# Patient Record
Sex: Male | Born: 1960 | Race: Black or African American | Hispanic: No | Smoking: Never smoker
Health system: Southern US, Community
[De-identification: ages and names within clinical notes are randomized; demographics above are authoritative.]

## PROBLEM LIST (undated history)

## (undated) DIAGNOSIS — B182 Chronic viral hepatitis C: Secondary | ICD-10-CM

## (undated) DIAGNOSIS — C801 Malignant (primary) neoplasm, unspecified: Secondary | ICD-10-CM

## (undated) DIAGNOSIS — F1020 Alcohol dependence, uncomplicated: Secondary | ICD-10-CM

## (undated) DIAGNOSIS — R188 Other ascites: Secondary | ICD-10-CM

## (undated) HISTORY — PX: PARACENTESIS: SHX844

---

## 2007-07-29 ENCOUNTER — Ambulatory Visit: Payer: Self-pay | Admitting: Gastroenterology

## 2013-06-23 ENCOUNTER — Ambulatory Visit: Payer: Self-pay | Admitting: Radiation Oncology

## 2016-02-29 ENCOUNTER — Emergency Department
Admission: EM | Admit: 2016-02-29 | Discharge: 2016-02-29 | Disposition: A | Discharge status: Home / Self Care | Payer: Self-pay | Attending: Emergency Medicine | Admitting: Emergency Medicine

## 2016-02-29 ENCOUNTER — Encounter: Payer: Self-pay | Admitting: Emergency Medicine

## 2016-02-29 DIAGNOSIS — R188 Other ascites: Secondary | ICD-10-CM | POA: Insufficient documentation

## 2016-02-29 DIAGNOSIS — R1084 Generalized abdominal pain: Secondary | ICD-10-CM

## 2016-02-29 HISTORY — DX: Chronic viral hepatitis C: B18.2

## 2016-02-29 LAB — URINALYSIS COMPLETE WITH MICROSCOPIC (ARMC ONLY)
BACTERIA UA: NONE SEEN
BILIRUBIN URINE: NEGATIVE
GLUCOSE, UA: NEGATIVE mg/dL
HGB URINE DIPSTICK: NEGATIVE
Ketones, ur: NEGATIVE mg/dL
Leukocytes, UA: NEGATIVE
Nitrite: NEGATIVE
Protein, ur: NEGATIVE mg/dL
RBC / HPF: NONE SEEN RBC/hpf (ref 0–5)
SQUAMOUS EPITHELIAL / LPF: NONE SEEN
Specific Gravity, Urine: 1.006 (ref 1.005–1.030)
pH: 5 (ref 5.0–8.0)

## 2016-02-29 LAB — COMPREHENSIVE METABOLIC PANEL
ALK PHOS: 267 U/L — AB (ref 38–126)
ALT: 76 U/L — ABNORMAL HIGH (ref 17–63)
AST: 214 U/L — AB (ref 15–41)
Albumin: 3 g/dL — ABNORMAL LOW (ref 3.5–5.0)
Anion gap: 8 (ref 5–15)
BILIRUBIN TOTAL: 2.4 mg/dL — AB (ref 0.3–1.2)
BUN: 8 mg/dL (ref 6–20)
CO2: 25 mmol/L (ref 22–32)
Calcium: 8.8 mg/dL — ABNORMAL LOW (ref 8.9–10.3)
Chloride: 101 mmol/L (ref 101–111)
Creatinine, Ser: 0.88 mg/dL (ref 0.61–1.24)
GFR calc Af Amer: >60 mL/min (ref >60)
GLUCOSE: 91 mg/dL (ref 65–99)
POTASSIUM: 4.4 mmol/L (ref 3.5–5.1)
Sodium: 134 mmol/L — ABNORMAL LOW (ref 135–145)
Total Protein: 8 g/dL (ref 6.5–8.1)

## 2016-02-29 LAB — CBC
HEMATOCRIT: 35.5 % — AB (ref 40.0–52.0)
HEMOGLOBIN: 12.4 g/dL — AB (ref 13.0–18.0)
MCH: 33.4 pg (ref 26.0–34.0)
MCHC: 34.9 g/dL (ref 32.0–36.0)
MCV: 95.9 fL (ref 80.0–100.0)
Platelets: 96 10*3/uL — ABNORMAL LOW (ref 150–440)
RBC: 3.7 MIL/uL — ABNORMAL LOW (ref 4.40–5.90)
RDW: 14.2 % (ref 11.5–14.5)
WBC: 3.9 10*3/uL (ref 3.8–10.6)

## 2016-02-29 LAB — LIPASE, BLOOD: Lipase: 47 U/L (ref 11–51)

## 2016-02-29 MED ORDER — OXYCODONE HCL 5 MG PO TABS: 5.0000 mg | ORAL_TABLET | Freq: Three times a day (TID) | ORAL | Status: AC | PRN

## 2016-02-29 MED ORDER — OXYCODONE-ACETAMINOPHEN 5-325 MG PO TABS: 1.0000 | ORAL_TABLET | Freq: Four times a day (QID) | ORAL | Status: DC | PRN

## 2016-02-29 NOTE — Discharge Instructions (Signed)
Please call the number provided for GI medicine to arrange a follow-up appointment as soon as possible. Take pain medication as needed, as written. As we discussed do not take pain medication while under the influence of alcohol. Return to the emergency department for any worsening abdominal pain, fever, or any other symptom personally concerning to yourself.   Abdominal Pain, Adult Many things can cause abdominal pain. Usually, abdominal pain is not caused by a disease and will improve without treatment. It can often be observed and treated at home. Your health care provider will do a physical exam and possibly order blood tests and X-rays to help determine the seriousness of your pain. However, in many cases, more time must pass before a clear cause of the pain can be found. Before that point, your health care provider may not know if you need more testing or further treatment. HOME CARE INSTRUCTIONS Monitor your abdominal pain for any changes. The following actions may help to alleviate any discomfort you are experiencing:  Only take over-the-counter or prescription medicines as directed by your health care provider.  Do not take laxatives unless directed to do so by your health care provider.  Try a clear liquid diet (broth, tea, or water) as directed by your health care provider. Slowly move to a bland diet as tolerated. SEEK MEDICAL CARE IF:  You have unexplained abdominal pain.  You have abdominal pain associated with nausea or diarrhea.  You have pain when you urinate or have a bowel movement.  You experience abdominal pain that wakes you in the night.  You have abdominal pain that is worsened or improved by eating food.  You have abdominal pain that is worsened with eating fatty foods.  You have a fever. SEEK IMMEDIATE MEDICAL CARE IF:  Your pain does not go away within 2 hours.  You keep throwing up (vomiting).  Your pain is felt only in portions of the abdomen, such as  the right side or the left lower portion of the abdomen.  You pass bloody or black tarry stools. MAKE SURE YOU:  Understand these instructions.  Will watch your condition.  Will get help right away if you are not doing well or get worse.   This information is not intended to replace advice given to you by your health care provider. Make sure you discuss any questions you have with your health care provider.   Document Released: 05/17/2005 Document Revised: 04/28/2015 Document Reviewed: 04/16/2013 Elsevier Interactive Patient Education [employer].

## 2016-02-29 NOTE — ED Provider Notes (Signed)
[facility] Emergency Department Provider Note  Time seen: 11:07 PM  I have reviewed the triage vital signs and the nursing notes.   HISTORY  Chief Complaint Abdominal Pain and Bloated    HPI Johnny [name] is a 55 y.o. male with a past medical history of hepatitis C, alcohol abuse who presents to the emergency department with a distended stomach. According to the patient he was diagnosed with hepatitis C proximal neck 7 years ago, states he continues to drink alcohol however he has cut back to 2-4 beers per day. He states for the last 2 months his abdomen has continued to swell. States it is now very swollen and he gets full after 1 beer or when he eats small amounts of food. Denies any acute worsening today. States he is taking his wife to the emergency department for migraine headaches and decided to get his abdomen evaluated. Denies any fever. States diffuse abdominal tightness but denies any abdominal "pain." Denies a nausea or vomiting. Denies diarrhea, black or bloody stool.     Past Medical History  Diagnosis Date  . Hep C w/ coma, chronic (HCC)     There are no active problems to display for this patient.   History reviewed. No pertinent past surgical history.  No current outpatient prescriptions on file.  Allergies Review of patient's allergies indicates no known allergies.  History reviewed. No pertinent family history.  Social History Social History  Substance Use Topics  . Smoking status: Never Smoker   . Smokeless tobacco: None  . Alcohol Use: Yes    Review of Systems Constitutional: Negative for fever Cardiovascular: Negative for chest pain. Respiratory: Negative for shortness of breath. Gastrointestinal: Positive for abdominal tightness, discomfort, nd distention. Genitourinary: Negative for dysuria. Musculoskeletal: Negative for back pain. Neurological: Negative for headache 10-point ROS otherwise  negative.  ____________________________________________   PHYSICAL EXAM:  VITAL SIGNS: ED Triage Vitals  Enc Vitals Group     BP 02/29/16 1955 143/111 mmHg     Pulse Rate 02/29/16 1955 75     Resp 02/29/16 1955 20     Temp 02/29/16 1955 98.1 F (36.7 C)     Temp Source 02/29/16 1955 Oral     SpO2 02/29/16 1955 98 %     Weight 02/29/16 1955 139 lb (63.05 kg)     Height 02/29/16 1955 5\' 7"  (1.702 m)     Head Cir --      Peak Flow --      Pain Score 02/29/16 1956 6     Pain Loc --      Pain Edu? --      Excl. in [place]? --     Constitutional: Alert and oriented. Well appearing and in no distress. Eyes: Mild scleral icterus.ENT   Head: Normocephalic and atraumatic   Mouth/Throat: Mucous membranes are moist. Cardiovascular: Normal rate, regular rhythm. No murmur Respiratory: Normal respiratory effort without tachypnea nor retractions. Breath sounds are clear  Gastrointestinal:  Mild to moderate abdominal distention, no focal tenderness to palpation. No rebound or guarding. Musculoskeletal: Nontender with normal range of motion in all extremities.  Neurologic:  Normal speech and language. No gross focal neurologic deficits  Skin:  Skin is warm, dry and intact.  Psychiatric: Mood and affect are normal.  ____________________________________________    INITIAL IMPRESSION / ASSESSMENT AND PLAN / ED COURSE  Pertinent labs & imaging results that were available during my care of the patient were reviewed by me and considered  in my medical decision making (see chart for details).  The patient presents emergency department with abdominal distention which she states is been ongoing for the past several months. Patient's labs show elevated LFTs likely do to his chronic alcohol use and hepatitis C. Patient has no right upper quadrant tenderness to palpation. Patient states occasional abdominal pain when he feels very "bloated." I had a long conversation with the patient that he needs  to stop drinking alcohol as this will only progressed until his liver completely fails. Patient understands and states he will try his best to reduce or stop drinking alcohol. I will refer the patient to GI medicine for further workup and possible arrangement of outpatient paracentesis. Patient agreeable to plan. We will discharge with a short course of pain medication. No clinical concern for SBP.  ____________________________________________   FINAL CLINICAL IMPRESSION(S) / ED DIAGNOSES  abdominal ascites   Harvest Dark, MD 02/29/16 2312

## 2016-02-29 NOTE — ED Notes (Signed)
Pt reports being diagnosed with Hepatitis C 5-7 years ago.  Pt has distended firm stomach and slight jaundice to the sclera of the eyes.  Pt reports that he also drinks 2-4 12 oz beers a day but cannot eat much due to swelling in abdomen.  Pt reports diarrhea after meals as well that happens off an on.  Pt came into room with a honey bun.  Instructed pt to stay NPO until Doctor saw pt and confirmed if it is okay to eat at this time.  Pt hooked up to monitors and given remote.  Pt denied need for blanket at this time.

## 2016-02-29 NOTE — ED Notes (Signed)
Pt arrived to the ED for complaints of abdominal pain, bloated abdomen and SOB. Pt states that he was recently diagnosed with Hep C and he does not know if it has anything to do with it. Pt is AOx4 in no apparent distress.

## 2016-03-01 NOTE — ED Notes (Signed)
Pt discharged to home.  Family member driving.  Discharge instructions reviewed.  Verbalized understanding.  No questions or concerns at this time.  Teach back verified.  Pt in NAD.  No items left in ED.   

## 2016-03-03 ENCOUNTER — Emergency Department: Payer: Self-pay

## 2016-03-03 ENCOUNTER — Encounter: Payer: Self-pay | Admitting: Emergency Medicine

## 2016-03-03 ENCOUNTER — Emergency Department
Admission: EM | Admit: 2016-03-03 | Discharge: 2016-03-03 | Disposition: A | Discharge status: Home / Self Care | Payer: Self-pay | Attending: Emergency Medicine | Admitting: Emergency Medicine

## 2016-03-03 DIAGNOSIS — R109 Unspecified abdominal pain: Secondary | ICD-10-CM | POA: Insufficient documentation

## 2016-03-03 DIAGNOSIS — Z79899 Other long term (current) drug therapy: Secondary | ICD-10-CM | POA: Insufficient documentation

## 2016-03-03 LAB — BODY FLUID CELL COUNT WITH DIFFERENTIAL
Eos, Fluid: 0 %
Lymphs, Fluid: 100 %
Monocyte-Macrophage-Serous Fluid: 0 %
Neutrophil Count, Fluid: 0 %
Other Cells, Fluid: 0 %
WBC FLUID: 352 uL

## 2016-03-03 LAB — CBC
HCT: 34.1 % — ABNORMAL LOW (ref 40.0–52.0)
Hemoglobin: 11.9 g/dL — ABNORMAL LOW (ref 13.0–18.0)
MCH: 33.9 pg (ref 26.0–34.0)
MCHC: 34.9 g/dL (ref 32.0–36.0)
MCV: 97.3 fL (ref 80.0–100.0)
PLATELETS: 88 10*3/uL — AB (ref 150–440)
RBC: 3.51 MIL/uL — AB (ref 4.40–5.90)
RDW: 13.7 % (ref 11.5–14.5)
WBC: 4 10*3/uL (ref 3.8–10.6)

## 2016-03-03 LAB — COMPREHENSIVE METABOLIC PANEL
ALT: 70 U/L — AB (ref 17–63)
ANION GAP: 8 (ref 5–15)
AST: 168 U/L — ABNORMAL HIGH (ref 15–41)
Albumin: 2.7 g/dL — ABNORMAL LOW (ref 3.5–5.0)
Alkaline Phosphatase: 224 U/L — ABNORMAL HIGH (ref 38–126)
BUN: 10 mg/dL (ref 6–20)
CHLORIDE: 101 mmol/L (ref 101–111)
CO2: 24 mmol/L (ref 22–32)
Calcium: 8.7 mg/dL — ABNORMAL LOW (ref 8.9–10.3)
Creatinine, Ser: 0.8 mg/dL (ref 0.61–1.24)
GFR calc non Af Amer: >60 mL/min (ref >60)
Glucose, Bld: 93 mg/dL (ref 65–99)
POTASSIUM: 4.1 mmol/L (ref 3.5–5.1)
SODIUM: 133 mmol/L — AB (ref 135–145)
Total Bilirubin: 3.5 mg/dL — ABNORMAL HIGH (ref 0.3–1.2)
Total Protein: 7.3 g/dL (ref 6.5–8.1)

## 2016-03-03 LAB — PROTIME-INR
INR: 1.14
Prothrombin Time: 14.8 seconds (ref 11.4–15.0)

## 2016-03-03 NOTE — ED Provider Notes (Signed)
[facility] Emergency Department Provider Note  Time seen: 8:25 AM  I have reviewed the triage vital signs and the nursing notes.   HISTORY  Chief Complaint Abdominal Pain    HPI Johnny [name] is a 55 y.o. male with a past medical history of hepatitis C, alcohol abuse who presents the emergency department with continued abdominal distention and discomfort. According to the patient he was seen here 2 days ago for the same by myself. He states he is not drinking any alcohol since he was last seen in the emergency department. States his abdomen continues to be very swollen and equal in cannot eat or drink anything due to the abdominal swelling and discomfort/fullness that it causes. Denies any "pain." States his abdomen feels extremely tight. Denies fever. Denies nausea, vomiting, diarrhea.     Past Medical History  Diagnosis Date  . Hep C w/ coma, chronic (HCC)     There are no active problems to display for this patient.   History reviewed. No pertinent past surgical history.  Current Outpatient Rx  Name  Route  Sig  Dispense  Refill  . oxyCODONE (ROXICODONE) 5 MG immediate release tablet   Oral   Take 1 tablet (5 mg total) by mouth every 8 (eight) hours as needed.   10 tablet   0     Allergies Review of patient's allergies indicates no known allergies.  No family history on file.  Social History Social History  Substance Use Topics  . Smoking status: Never Smoker   . Smokeless tobacco: None  . Alcohol Use: Yes    Review of Systems Constitutional: Negative for fever. Cardiovascular: Negative for chest pain. Respiratory: Negative for shortness of breath. Gastrointestinal: Abdominal tightness and distention Musculoskeletal: Negative for back pain. Neurological: Negative for headache 10-point ROS otherwise negative.  ____________________________________________   PHYSICAL EXAM:  VITAL SIGNS: ED Triage Vitals  Enc Vitals  Group     BP 03/03/16 0817 139/97 mmHg     Pulse Rate 03/03/16 0817 96     Resp 03/03/16 0817 18     Temp 03/03/16 0817 98.1 F (36.7 C)     Temp Source 03/03/16 0817 Oral     SpO2 03/03/16 0817 100 %     Weight 03/03/16 0818 135 lb (61.236 kg)     Height 03/03/16 0818 5\' 7"  (1.702 m)     Head Cir --      Peak Flow --      Pain Score --      Pain Loc --      Pain Edu? --      Excl. in [place]? --     Constitutional: Alert and oriented. Well appearing and in no distress. Eyes: Normal exam ENT   Head: Normocephalic and atraumatic.   Mouth/Throat: Mucous membranes are moist. Cardiovascular: Normal rate, regular rhythm. No murmur Respiratory: Normal respiratory effort without tachypnea nor retractions. Breath sounds are clear  Gastrointestinal: Moderately distended, dull percussion consistent with ascitic fluid. Tight but no tenderness to palpation. Musculoskeletal: Nontender with normal range of motion in all extremities.  Neurologic:  Normal speech and language. No gross focal neurologic deficits Skin:  Skin is warm, dry and intact.  Psychiatric: Mood and affect are normal. Speech and behavior are normal  ____________________________________________   INITIAL IMPRESSION / ASSESSMENT AND PLAN / ED COURSE  Pertinent labs & imaging results that were available during my care of the patient were reviewed by me and considered in my  medical decision making (see chart for details).  The patient presents the emergency department with continued abdominal distention and tightness. Patient admits alcohol use besides last use was 3 days ago. Patient's abdomen is nontender, but it is distended, taut, with dull percussion consistent with significant ascitic fluid. Patient has never had a paracentesis performed. Patient was seen in the emergency department 2 days ago for the same told to follow-up with GI medicine, the patient has not done so yet. I once again discussed with the patient the  need to discontinue alcohol use and follow-up with GI medicine for ongoing treatment. Patient is agreeable to this plan. I will discuss with interventional radiology to see if we can get an ultrasound-guided paracentesis performed today.  Ultrasound-guided paracentesis was per patient to follow-up with GI medicine as scheduled. formed today removing 3.8 L. Labs are largely at the patient's baseline, ascitic fluid appears normal. ____________________________________________   FINAL CLINICAL IMPRESSION(S) / ED DIAGNOSES  Abdominal distention   Harvest Dark, MD 03/03/16 1157

## 2016-03-03 NOTE — Procedures (Signed)
Successful US guided paracentesis yielding 3.8 L of serous ascitic fluid. Sample sent to laboratory as requested. EBL: None No immediate post procedural complications.   Ronny Bacon, MD Pager #: [phone]

## 2016-03-03 NOTE — ED Notes (Signed)
Pt here today with continual abd swelling, pt states that he was seen a week ago and told that if he couldn't get into see his primary for re-eval to return to er, pt returning due to the continual abd swelling. No distress noted, cont to monitor

## 2016-03-03 NOTE — ED Notes (Signed)
Pt. Verbalizes understanding of d/c instructions, and follow-up. VS stable.  Pt. In NAD at time of d/c and denies concerns. Pt. ambulatory Out of the unit wit steady gait and in good spirits.

## 2016-03-03 NOTE — Discharge Instructions (Signed)

## 2016-03-03 NOTE — ED Notes (Signed)
Pt taken to us.

## 2016-03-03 NOTE — ED Notes (Signed)
States he was seen last week and was told if he could not f/u within a week to "return to the ER to get his abd drained".  NAD

## 2016-03-06 LAB — CYTOLOGY - NON PAP

## 2016-03-07 LAB — BODY FLUID CULTURE: Culture: NO GROWTH

## 2016-03-23 ENCOUNTER — Other Ambulatory Visit: Payer: Self-pay | Admitting: Nurse Practitioner

## 2016-03-23 ENCOUNTER — Ambulatory Visit
Admission: RE | Admit: 2016-03-23 | Discharge: 2016-03-23 | Disposition: A | Discharge status: Home / Self Care | Payer: Self-pay | Source: Ambulatory Visit | Attending: Nurse Practitioner | Admitting: Nurse Practitioner

## 2016-03-23 DIAGNOSIS — B1920 Unspecified viral hepatitis C without hepatic coma: Secondary | ICD-10-CM

## 2016-03-23 DIAGNOSIS — R1084 Generalized abdominal pain: Secondary | ICD-10-CM

## 2016-03-23 DIAGNOSIS — R188 Other ascites: Secondary | ICD-10-CM

## 2016-03-23 DIAGNOSIS — B182 Chronic viral hepatitis C: Secondary | ICD-10-CM

## 2016-03-23 DIAGNOSIS — R140 Abdominal distension (gaseous): Secondary | ICD-10-CM

## 2016-03-23 DIAGNOSIS — K7460 Unspecified cirrhosis of liver: Secondary | ICD-10-CM | POA: Insufficient documentation

## 2016-03-23 DIAGNOSIS — K8020 Calculus of gallbladder without cholecystitis without obstruction: Secondary | ICD-10-CM | POA: Insufficient documentation

## 2016-03-23 NOTE — Discharge Instructions (Signed)
Paracentesis, Care After °Refer to this sheet in the next few weeks. These instructions provide you with information about caring for yourself after your procedure. Your health care provider may also give you more specific instructions. Your treatment has been planned according to current medical practices, but problems sometimes occur. Call your health care provider if you have any problems or questions after your procedure. °WHAT TO EXPECT AFTER THE PROCEDURE °After your procedure, it is common to have a small amount of clear fluid coming from the puncture site. °HOME CARE INSTRUCTIONS °· Return to your normal activities as told by your health care provider. Ask your health care provider what activities are safe for you. °· Take over-the-counter and prescription medicines only as told by your health care provider. °· Do not take baths, swim, or use a hot tub until your health care provider approves. °· Follow instructions from your health care provider about: °¨ How to take care of your puncture site. °¨ When and how you should change your bandage (dressing). °¨ When you should remove your dressing. °· Check your puncture area every day signs of infection. Watch for: °¨ Redness, swelling, or pain. °¨ Fluid, blood, or pus. °· Keep all follow-up visits as told by your health care provider. This is important. °SEEK MEDICAL CARE IF: °· You have redness, swelling, or pain at your puncture site. °· You start to have more clear fluid coming from your puncture site. °· You have blood or pus coming from your puncture site. °· You have chills. °· You have a fever. °SEEK IMMEDIATE MEDICAL CARE IF: °· You develop chest pain or shortness of breath. °· You develop increasing pain, discomfort, or swelling in your abdomen. °· You feel dizzy or light-headed or you pass out. °  °This information is not intended to replace advice given to you by your health care provider. Make sure you discuss any questions you have with your health  care provider. °  °Document Released: 12/22/2014 Document Reviewed: 12/22/2014 °Elsevier Interactive Patient Education ©2016 Elsevier Inc[employer] ° °

## 2016-03-24 ENCOUNTER — Ambulatory Visit
Admission: RE | Admit: 2016-03-24 | Discharge: 2016-03-24 | Disposition: A | Discharge status: Home / Self Care | Payer: Self-pay | Source: Ambulatory Visit | Attending: Nurse Practitioner | Admitting: Nurse Practitioner

## 2016-03-24 DIAGNOSIS — B182 Chronic viral hepatitis C: Secondary | ICD-10-CM

## 2016-03-24 DIAGNOSIS — R1084 Generalized abdominal pain: Secondary | ICD-10-CM

## 2016-03-24 DIAGNOSIS — R188 Other ascites: Secondary | ICD-10-CM | POA: Insufficient documentation

## 2016-03-24 LAB — BODY FLUID CELL COUNT WITH DIFFERENTIAL
Eos, Fluid: 0 %
LYMPHS FL: 20 %
Monocyte-Macrophage-Serous Fluid: 70 %
Neutrophil Count, Fluid: 10 %
Total Nucleated Cell Count, Fluid: 127 cu mm

## 2016-03-24 LAB — PROTEIN, BODY FLUID

## 2016-03-24 LAB — ALBUMIN, FLUID (OTHER): Albumin, Fluid: <1 g/dL

## 2016-03-24 NOTE — Procedures (Signed)
Successful US guided paracentesis yielding 6 L of serous ascitic fluid. Sample sent to laboratory as requested. EBL: None No immediate post procedural complications.   Ronny Bacon, MD Pager #: [phone]

## 2016-03-25 LAB — MISC LABCORP TEST (SEND OUT): LABCORP TEST CODE: 19588

## 2016-03-27 LAB — CYTOLOGY - NON PAP

## 2016-03-28 LAB — BODY FLUID CULTURE: Culture: NO GROWTH

## 2016-04-03 ENCOUNTER — Ambulatory Visit: Payer: Self-pay

## 2016-04-07 ENCOUNTER — Emergency Department: Payer: Self-pay

## 2016-04-07 ENCOUNTER — Encounter: Payer: Self-pay | Admitting: Emergency Medicine

## 2016-04-07 ENCOUNTER — Emergency Department
Admission: EM | Admit: 2016-04-07 | Discharge: 2016-04-07 | Disposition: A | Discharge status: Home / Self Care | Payer: Self-pay | Attending: Emergency Medicine | Admitting: Emergency Medicine

## 2016-04-07 DIAGNOSIS — R188 Other ascites: Secondary | ICD-10-CM | POA: Insufficient documentation

## 2016-04-07 DIAGNOSIS — R0602 Shortness of breath: Secondary | ICD-10-CM | POA: Insufficient documentation

## 2016-04-07 LAB — PROTIME-INR
INR: 1.05
PROTHROMBIN TIME: 13.7 s (ref 11.4–15.2)

## 2016-04-07 LAB — COMPREHENSIVE METABOLIC PANEL
ALK PHOS: 206 U/L — AB (ref 38–126)
ALT: 46 U/L (ref 17–63)
AST: 84 U/L — AB (ref 15–41)
Albumin: 2.5 g/dL — ABNORMAL LOW (ref 3.5–5.0)
Anion gap: 6 (ref 5–15)
BUN: 19 mg/dL (ref 6–20)
CALCIUM: 8.9 mg/dL (ref 8.9–10.3)
CHLORIDE: 106 mmol/L (ref 101–111)
CO2: 25 mmol/L (ref 22–32)
CREATININE: 1.46 mg/dL — AB (ref 0.61–1.24)
GFR, EST AFRICAN AMERICAN: 60 mL/min — AB (ref >60)
GFR, EST NON AFRICAN AMERICAN: 52 mL/min — AB (ref >60)
Glucose, Bld: 94 mg/dL (ref 65–99)
Potassium: 4.6 mmol/L (ref 3.5–5.1)
SODIUM: 137 mmol/L (ref 135–145)
Total Bilirubin: 2.2 mg/dL — ABNORMAL HIGH (ref 0.3–1.2)
Total Protein: 7.3 g/dL (ref 6.5–8.1)

## 2016-04-07 LAB — CBC
HCT: 36.1 % — ABNORMAL LOW (ref 40.0–52.0)
Hemoglobin: 12.7 g/dL — ABNORMAL LOW (ref 13.0–18.0)
MCH: 33.3 pg (ref 26.0–34.0)
MCHC: 35.1 g/dL (ref 32.0–36.0)
MCV: 94.9 fL (ref 80.0–100.0)
PLATELETS: 125 10*3/uL — AB (ref 150–440)
RBC: 3.81 MIL/uL — ABNORMAL LOW (ref 4.40–5.90)
RDW: 13.8 % (ref 11.5–14.5)
WBC: 5.1 10*3/uL (ref 3.8–10.6)

## 2016-04-07 LAB — LIPASE, BLOOD: LIPASE: 44 U/L (ref 11–51)

## 2016-04-07 NOTE — ED Provider Notes (Signed)
[facility] Emergency Department Provider Note  Time seen: 9:05 AM  I have reviewed the triage vital signs and the nursing notes.   HISTORY  Chief Complaint Shortness of Breath    HPI Johnny [name] is a 55 y.o. male with a past medical history of hepatitis C, abdominal ascites, presents the emergency department for increased abdominal swelling and difficulty breathing. According to the patient for the past 5 or 6 days he has had increased abdominal swelling, tightness, shortness of breath with exertion. Patient states it felt like the last time he needed his abdomen drained. Patient has received 2 abdominal drains in the past, 7/14 and 8/4. He is currently working with his primary care doctor to arrange these on an outpatient basis, but has not yet been able to do so. He called his primary care doctor today as he was unable to sleep last night due to the abdominal tightness and a recommended he come to the emergency department for evaluation. Describes abdominal discomfort as tightness sensation, moderate in severity. Shortness of breath is mild, he feels like his abdomen swelling is causing the shortness of breath. Denies any chest pain. Denies any leg swelling.  Past Medical History:  Diagnosis Date  . Hep C w/ coma, chronic (HCC)     There are no active problems to display for this patient.   History reviewed. No pertinent surgical history.  Prior to Admission medications   Medication Sig Start Date End Date Taking? Authorizing Provider  oxyCODONE (ROXICODONE) 5 MG immediate release tablet Take 1 tablet (5 mg total) by mouth every 8 (eight) hours as needed. 02/29/16 02/28/17  Harvest Dark, MD    No Known Allergies  History reviewed. No pertinent family history.  Social History Social History  Substance Use Topics  . Smoking status: Never Smoker  . Smokeless tobacco: Not on file  . Alcohol use Yes    Review of Systems Constitutional:  Negative for fever. Cardiovascular: Negative for chest pain. Respiratory: Positive shortness of breath. Gastrointestinal: Positive for abdominal swelling and tightness. Negative for vomiting or diarrhea. Genitourinary: Negative for dysuria. Neurological: Negative for headache 10-point ROS otherwise negative.  ____________________________________________   PHYSICAL EXAM:  VITAL SIGNS: ED Triage Vitals  Enc Vitals Group     BP 04/07/16 0837 (!) 119/95     Pulse Rate 04/07/16 0837 71     Resp 04/07/16 0837 20     Temp 04/07/16 0837 97.8 F (36.6 C)     Temp Source 04/07/16 0837 Oral     SpO2 04/07/16 0837 100 %     Weight 04/07/16 0834 133 lb (60.3 kg)     Height 04/07/16 0834 5\' 7"  (1.702 m)     Head Circumference --      Peak Flow --      Pain Score 04/07/16 0834 7     Pain Loc --      Pain Edu? --      Excl. in [place]? --     Constitutional: Alert and oriented. Well appearing and in no distress. Eyes: Normal exam ENT   Head: Normocephalic and atraumatic.   Mouth/Throat: Mucous membranes are moist. Cardiovascular: Normal rate, regular rhythm. No murmur Respiratory: Normal respiratory effort without tachypnea nor retractions. Breath sounds are clear Gastrointestinal: Abdominal distention, dull percussion. Nontender Musculoskeletal: Nontender with normal range of motion in all extremities.  Neurologic:  Normal speech and language. No gross focal neurologic deficits  Skin:  Skin is warm, dry and intact.  Psychiatric: Mood and affect are normal. Speech and behavior are normal.   ____________________________________________    EKG  EKG reviewed and interpreted by myself shows normal sinus rhythm at 83 bpm, narrow QRS, normal axis, no clear concerning ST changes. Considerable electrical interference.  ____________________________________________    RADIOLOGY  Bibasilar atelectasis  ____________________________________________   INITIAL IMPRESSION / ASSESSMENT  AND PLAN / ED COURSE  Pertinent labs & imaging results that were available during my care of the patient were reviewed by me and considered in my medical decision making (see chart for details).  The patient presents the emergency department with increased abdominal distention, swelling, and was shortness of breath due to abdominal distention. Patient has a nontender exam, nontender right upper quadrant. We will check labs, discussed with interventional radiology for possible therapeutic paracentesis.   The patient's labs are largely at baseline. Chest x-ray shows bibasilar atelectasis otherwise within normal limits. Patient has completed a therapeutic paracentesis in the emergency department, states his symptoms are completely gone. He will follow-up with his doctor who is currently working on obtaining outpatient paracentesis for the patient.  ____________________________________________   FINAL CLINICAL IMPRESSION(S) / ED DIAGNOSES  Abdominal distention Shortness of breath    Harvest Dark, MD 04/07/16 1248

## 2016-04-07 NOTE — ED Notes (Signed)
Patient denies pain and is resting comfortably.  

## 2016-04-07 NOTE — ED Triage Notes (Signed)
Pt had 5.5 L drained from abdomen 03/24/16 for ascites. Hx hep C. Started having [facility] Tuesday this week and reports felt like was "going to fall out walking to my moms". Has pain to RUQ under ribs.

## 2016-04-18 ENCOUNTER — Other Ambulatory Visit: Payer: Self-pay | Admitting: Nurse Practitioner

## 2016-04-18 DIAGNOSIS — R188 Other ascites: Secondary | ICD-10-CM

## 2016-04-18 DIAGNOSIS — K7031 Alcoholic cirrhosis of liver with ascites: Secondary | ICD-10-CM

## 2016-04-20 ENCOUNTER — Ambulatory Visit
Admission: RE | Admit: 2016-04-20 | Discharge: 2016-04-20 | Disposition: A | Discharge status: Home / Self Care | Payer: Self-pay | Source: Ambulatory Visit | Attending: Nurse Practitioner | Admitting: Nurse Practitioner

## 2016-04-20 DIAGNOSIS — K7031 Alcoholic cirrhosis of liver with ascites: Secondary | ICD-10-CM | POA: Insufficient documentation

## 2016-04-20 MED ORDER — ALBUMIN HUMAN 25 % IV SOLN
25.0000 g | Freq: Once | INTRAVENOUS | Status: AC
  Administered 2016-04-20: 25 g via INTRAVENOUS
  Filled 2016-04-20: qty 100

## 2016-04-20 MED ORDER — ALBUMIN HUMAN 25 % IV SOLN
25.0000 g | Freq: Once | INTRAVENOUS | Status: DC
  Filled 2016-04-20: qty 100

## 2016-04-25 ENCOUNTER — Other Ambulatory Visit: Payer: Self-pay | Admitting: Nurse Practitioner

## 2016-04-25 DIAGNOSIS — K7031 Alcoholic cirrhosis of liver with ascites: Secondary | ICD-10-CM

## 2016-05-01 ENCOUNTER — Other Ambulatory Visit: Payer: Self-pay | Admitting: Nurse Practitioner

## 2016-05-01 DIAGNOSIS — K7031 Alcoholic cirrhosis of liver with ascites: Secondary | ICD-10-CM

## 2016-05-04 ENCOUNTER — Ambulatory Visit: Admission: RE | Admit: 2016-05-04 | Payer: Self-pay | Source: Ambulatory Visit

## 2016-05-05 ENCOUNTER — Other Ambulatory Visit: Payer: Self-pay | Admitting: Nurse Practitioner

## 2016-05-05 DIAGNOSIS — K7031 Alcoholic cirrhosis of liver with ascites: Secondary | ICD-10-CM

## 2016-05-11 ENCOUNTER — Ambulatory Visit
Admission: RE | Admit: 2016-05-11 | Discharge: 2016-05-11 | Disposition: A | Discharge status: Home / Self Care | Payer: Self-pay | Source: Ambulatory Visit | Attending: Nurse Practitioner | Admitting: Nurse Practitioner

## 2016-05-11 DIAGNOSIS — K7031 Alcoholic cirrhosis of liver with ascites: Secondary | ICD-10-CM | POA: Insufficient documentation

## 2016-05-11 MED ORDER — ALBUMIN HUMAN 25 % IV SOLN
25.0000 g | Freq: Once | INTRAVENOUS | Status: AC
  Administered 2016-05-11: 25 g via INTRAVENOUS
  Filled 2016-05-11: qty 100

## 2016-05-11 MED ORDER — ALBUMIN HUMAN 25 % IV SOLN
23.0000 g | Freq: Once | INTRAVENOUS | Status: AC
  Administered 2016-05-11: 23 g via INTRAVENOUS
  Filled 2016-05-11: qty 100

## 2016-05-11 NOTE — Procedures (Signed)
Under US guidance, paracentesis was performed. No immediate complication. 

## 2016-05-18 ENCOUNTER — Ambulatory Visit
Admission: RE | Admit: 2016-05-18 | Discharge: 2016-05-18 | Disposition: A | Discharge status: Home / Self Care | Payer: Self-pay | Source: Ambulatory Visit | Attending: Nurse Practitioner | Admitting: Nurse Practitioner

## 2016-05-18 DIAGNOSIS — K7031 Alcoholic cirrhosis of liver with ascites: Secondary | ICD-10-CM | POA: Insufficient documentation

## 2016-05-18 MED ORDER — ALBUMIN HUMAN 25 % IV SOLN
50.0000 g | Freq: Once | INTRAVENOUS | Status: DC
  Filled 2016-05-18: qty 200

## 2016-05-18 NOTE — Procedures (Signed)
Successful US guided paracentesis yielding 6.6 L of serous ascitic fluid. EBL: None No immediate post procedural complications.   Ronny Bacon, MD Pager #: [phone]

## 2016-06-01 ENCOUNTER — Ambulatory Visit: Admission: RE | Admit: 2016-06-01 | Payer: Self-pay | Source: Ambulatory Visit

## 2016-06-08 ENCOUNTER — Ambulatory Visit: Payer: Self-pay

## 2016-06-15 ENCOUNTER — Ambulatory Visit: Admission: RE | Admit: 2016-06-15 | Payer: Self-pay | Source: Ambulatory Visit

## 2016-06-16 ENCOUNTER — Ambulatory Visit
Admission: RE | Admit: 2016-06-16 | Discharge: 2016-06-16 | Disposition: A | Discharge status: Home / Self Care | Payer: Self-pay | Source: Ambulatory Visit | Attending: Nurse Practitioner | Admitting: Nurse Practitioner

## 2016-06-16 DIAGNOSIS — K7031 Alcoholic cirrhosis of liver with ascites: Secondary | ICD-10-CM | POA: Insufficient documentation

## 2016-06-16 NOTE — Progress Notes (Signed)
Patient refused albumin.

## 2016-06-16 NOTE — H&P (Signed)
Chief Complaint: Patient was seen in consultation today for a paracentesis at the request of Martin,Dorine  Referring Physician(s): Martin,Dorine   Patient Status: [facility] - Out-pt  History of Present Illness: Johnny [name] is a 55 y.o. male with hepatitis C and cirrhosis. Patient presents for routine therapeutic paracentesis.  Last paracentesis was 05/18/16.  No new problems.  Denies chest pain, fever or shortness of breath.    Past Medical History:  Diagnosis Date  . Hep C w/ coma, chronic (HCC)     No past surgical history on file.  Allergies: Review of patient's allergies indicates no known allergies.  Medications: Prior to Admission medications   Medication Sig Start Date End Date Taking? Authorizing Provider  oxyCODONE (ROXICODONE) 5 MG immediate release tablet Take 1 tablet (5 mg total) by mouth every 8 (eight) hours as needed. 02/29/16 02/28/17  Harvest Dark, MD     No family history on file.  Social History   Social History  . Marital status: Divorced    Spouse name: N/A  . Number of children: N/A  . Years of education: N/A   Social History Main Topics  . Smoking status: Never Smoker  . Smokeless tobacco: Not on file  . Alcohol use Yes  . Drug use: No  . Sexual activity: Yes    Birth control/ protection: None   Other Topics Concern  . Not on file   Social History Narrative  . No narrative on file     Review of Systems  Constitutional: Negative for activity change, chills and fever.  Respiratory: Negative.   Cardiovascular: Negative for chest pain and leg swelling.  Gastrointestinal: Positive for abdominal distention. Negative for abdominal pain.    Vital Signs: BP (!) 125/95 (BP Location: Left Arm)   Pulse 98   Resp 18   SpO2 100%   Physical Exam  Cardiovascular: Normal rate, regular rhythm and normal heart sounds.   Pulmonary/Chest: Effort normal and breath sounds normal.  Abdominal: He exhibits distension.       Imaging: US Paracentesis  Result Date: 05/18/2016 INDICATION: Recurrent symptomatic ascites. EXAM: ULTRASOUND-GUIDED PARACENTESIS COMPARISON:  Multiple previous ultrasound-guided paracenteses. MEDICATIONS: None. COMPLICATIONS: None immediate. TECHNIQUE: Informed written consent was obtained from the patient after a discussion of the risks, benefits and alternatives to treatment. A timeout was performed prior to the initiation of the procedure. Initial ultrasound scanning demonstrates a large amount of ascites within the right lower abdominal quadrant. The right lower abdomen was prepped and draped in the usual sterile fashion. 1% lidocaine with epinephrine was used for local anesthesia. An ultrasound image was saved for documentation purposed. An 8 Fr Safe-T-Centesis catheter was introduced. The paracentesis was performed. The catheter was removed and a dressing was applied. The patient tolerated the procedure well without immediate post procedural complication. FINDINGS: A total of approximately 6.6 liters of serous fluid was removed. IMPRESSION: Successful ultrasound-guided paracentesis yielding 6.6 liters of peritoneal fluid. Electronically Signed   By: Sandi Mariscal M.D.   On: 05/18/2016 13:54    Labs:  CBC:  Recent Labs  02/29/16 2008 03/03/16 0907 04/07/16 0905  WBC 3.9 4.0 5.1  HGB 12.4* 11.9* 12.7*  HCT 35.5* 34.1* 36.1*  PLT 96* 88* 125*    COAGS:  Recent Labs  03/03/16 0907 04/07/16 0905  INR 1.14 1.05    BMP:  Recent Labs  02/29/16 2008 03/03/16 0907 04/07/16 0905  NA 134* 133* 137  K 4.4 4.1 4.6  CL 101 101 106  CO2 25 24 25   GLUCOSE 91 93 94  BUN 8 10 19   CALCIUM 8.8* 8.7* 8.9  CREATININE 0.88 0.80 1.46*  GFRNONAA >60 >60 52*  GFRAA >60 >60 60*    LIVER FUNCTION TESTS:  Recent Labs  02/29/16 2008 03/03/16 0907 04/07/16 0905  BILITOT 2.4* 3.5* 2.2*  AST 214* 168* 84*  ALT 76* 70* 46  ALKPHOS 267* 224* 206*  PROT 8.0 7.3 7.3  ALBUMIN 3.0* 2.7*  2.5*    TUMOR MARKERS: No results for input(s): AFPTM, CEA, CA199, CHROMGRNA in the last 8760 hours.  Assessment and Plan:  54 yo with Hepatitis C and cirrhosis.  Patient is scheduled for US guided paracentesis.  Patient is aware of risks and benefits of procedure and wishes to proceed.  Patient does not want albumin.      Electronically Signed: Carylon Perches 06/16/2016, 2:35 PM

## 2016-06-16 NOTE — Procedures (Signed)
Successful US guided paracentesis.  Removed 8.5 liters of yellow fluid without complication.

## 2016-06-23 ENCOUNTER — Encounter (INDEPENDENT_AMBULATORY_CARE_PROVIDER_SITE_OTHER): Payer: Self-pay

## 2016-06-23 ENCOUNTER — Ambulatory Visit: Payer: Self-pay | Admitting: Pharmacy Technician

## 2016-06-23 DIAGNOSIS — Z79899 Other long term (current) drug therapy: Secondary | ICD-10-CM

## 2016-06-23 NOTE — Progress Notes (Signed)
Met with patient completed financial assistance application for [facility] due to recent visit.  Patient agreed to be responsible for gathering financial information and forwarding to appropriate department in [facility].  Also, completed financial assistance application for [facility].  Patient agreed to be responsible for gathering financial information and forwarding to [facility].    Completed [facility] application and contract.  Patient agreed to all terms of the [facility] contract.  Patient to provide lat 30 days of pay stubs and bank statements along with a utility bill.  Assisted patient with arranging for an appointment with [facility].  Appt made for 11/17.  In addition, provided patient with other community resource information based on his particular needs.  [facility]

## 2016-06-29 ENCOUNTER — Ambulatory Visit: Admission: RE | Admit: 2016-06-29 | Payer: Self-pay | Source: Ambulatory Visit

## 2016-06-29 ENCOUNTER — Telehealth: Payer: Self-pay | Admitting: Pharmacy Technician

## 2016-08-21 ENCOUNTER — Other Ambulatory Visit: Payer: Self-pay | Admitting: Nurse Practitioner

## 2016-08-21 ENCOUNTER — Emergency Department
Admission: EM | Admit: 2016-08-21 | Discharge: 2016-08-21 | Disposition: A | Discharge status: Home / Self Care | Payer: Self-pay | Attending: Emergency Medicine | Admitting: Emergency Medicine

## 2016-08-21 ENCOUNTER — Encounter: Payer: Self-pay | Admitting: Emergency Medicine

## 2016-08-21 DIAGNOSIS — Z9114 Patient's other noncompliance with medication regimen: Secondary | ICD-10-CM | POA: Insufficient documentation

## 2016-08-21 DIAGNOSIS — K7031 Alcoholic cirrhosis of liver with ascites: Secondary | ICD-10-CM

## 2016-08-21 DIAGNOSIS — R188 Other ascites: Secondary | ICD-10-CM | POA: Insufficient documentation

## 2016-08-21 DIAGNOSIS — Z79899 Other long term (current) drug therapy: Secondary | ICD-10-CM | POA: Insufficient documentation

## 2016-08-21 LAB — CBC WITH DIFFERENTIAL/PLATELET
BASOS PCT: 2 %
Basophils Absolute: 0.1 10*3/uL (ref 0–0.1)
Eosinophils Absolute: 0.1 10*3/uL (ref 0–0.7)
Eosinophils Relative: 2 %
HEMATOCRIT: 40.9 % (ref 40.0–52.0)
Hemoglobin: 14.2 g/dL (ref 13.0–18.0)
LYMPHS PCT: 28 %
Lymphs Abs: 1.7 10*3/uL (ref 1.0–3.6)
MCH: 31.5 pg (ref 26.0–34.0)
MCHC: 34.7 g/dL (ref 32.0–36.0)
MCV: 90.7 fL (ref 80.0–100.0)
MONO ABS: 0.6 10*3/uL (ref 0.2–1.0)
MONOS PCT: 11 %
NEUTROS ABS: 3.5 10*3/uL (ref 1.4–6.5)
Neutrophils Relative %: 57 %
Platelets: 165 10*3/uL (ref 150–440)
RBC: 4.51 MIL/uL (ref 4.40–5.90)
RDW: 13.3 % (ref 11.5–14.5)
WBC: 6.1 10*3/uL (ref 3.8–10.6)

## 2016-08-21 LAB — COMPREHENSIVE METABOLIC PANEL
ALT: 40 U/L (ref 17–63)
ANION GAP: 7 (ref 5–15)
AST: 67 U/L — ABNORMAL HIGH (ref 15–41)
Albumin: 3.1 g/dL — ABNORMAL LOW (ref 3.5–5.0)
Alkaline Phosphatase: 118 U/L (ref 38–126)
BILIRUBIN TOTAL: 1.5 mg/dL — AB (ref 0.3–1.2)
BUN: 15 mg/dL (ref 6–20)
CO2: 23 mmol/L (ref 22–32)
Calcium: 9.2 mg/dL (ref 8.9–10.3)
Chloride: 107 mmol/L (ref 101–111)
Creatinine, Ser: 1.07 mg/dL (ref 0.61–1.24)
Glucose, Bld: 93 mg/dL (ref 65–99)
POTASSIUM: 3.7 mmol/L (ref 3.5–5.1)
Sodium: 137 mmol/L (ref 135–145)
TOTAL PROTEIN: 7.8 g/dL (ref 6.5–8.1)

## 2016-08-21 LAB — LIPASE, BLOOD: Lipase: 33 U/L (ref 11–51)

## 2016-08-21 NOTE — Discharge Instructions (Signed)
Please come to the radiology department at [facility] tomorrow at 9:30 AM for paracentesis. In the meantime return to the emergency room if you develop abdominal pain, fever, difficulty breathing, or any other symptoms that are new to you.

## 2016-08-21 NOTE — ED Triage Notes (Signed)
C/o fluid building up in abd.  States he ran out of his fluid pills.  No sob.

## 2016-08-21 NOTE — ED Provider Notes (Signed)
[facility] Emergency Department Provider Note  ____________________________________________  Time seen: Approximately 12:45 PM  I have reviewed the triage vital signs and the nursing notes.   HISTORY  Chief Complaint Bloated   HPI Johnny [name] is a 56 y.o. male the history of hepatitis C and alcoholism complicated by cirrhosis and ascites who presents for evaluation of abdominal distention. Patient's last large-volume paracentesis was 2 months ago. Patient reports that he ran out of his Lasix for about a week and noticed that his abdomen started to swell. He restarted Lasix 3 days ago and continues to have significant swelling of his abdomen. He reports that now he has mild shortness of breath because of the swelling. He denies fever or chills, nausea or vomiting, chest pain, no melena, hematemesis, abdominal pain. He reports that he has not been urinating as much as he used to even after restarting the lasix.   Past Medical History:  Diagnosis Date  . Hep C w/ coma, chronic (HCC)     There are no active problems to display for this patient.   History reviewed. No pertinent surgical history.  Prior to Admission medications   Medication Sig Start Date End Date Taking? Authorizing Provider  oxyCODONE (ROXICODONE) 5 MG immediate release tablet Take 1 tablet (5 mg total) by mouth every 8 (eight) hours as needed. 02/29/16 02/28/17  Harvest Dark, MD    Allergies Patient has no known allergies.  No family history on file.  Social History Social History  Substance Use Topics  . Smoking status: Never Smoker  . Smokeless tobacco: Never Used  . Alcohol use Yes    Review of Systems  Constitutional: Negative for fever. Eyes: Negative for visual changes. ENT: Negative for sore throat. Neck: No neck pain  Cardiovascular: Negative for chest pain. Respiratory: Negative for shortness of breath. Gastrointestinal: Negative for abdominal pain,  vomiting or diarrhea. + abdominal distention Genitourinary: Negative for dysuria. Musculoskeletal: Negative for back pain. Skin: Negative for rash. Neurological: Negative for headaches, weakness or numbness. Psych: No SI or HI  ____________________________________________   PHYSICAL EXAM:  VITAL SIGNS: ED Triage Vitals [08/21/16 1045]  Enc Vitals Group     BP (!) 132/93     Pulse Rate 90     Resp 16     Temp 98 F (36.7 C)     Temp src      SpO2 99 %     Weight 123 lb (55.8 kg)     Height 5\' 6"  (1.676 m)     Head Circumference      Peak Flow      Pain Score 6     Pain Loc      Pain Edu?      Excl. in [place]?     Constitutional: Alert and oriented. Well appearing and in no apparent distress. HEENT:      Head: Normocephalic and atraumatic.         Eyes: Conjunctivae are normal. Sclera is non-icteric. EOMI. PERRL      Mouth/Throat: Mucous membranes are moist.       Neck: Supple with no signs of meningismus. Cardiovascular: Regular rate and rhythm. No murmurs, gallops, or rubs. 2+ symmetrical distal pulses are present in all extremities. No JVD. Respiratory: Normal respiratory effort. Lungs are clear to auscultation bilaterally. No wheezes, crackles, or rhonchi.  Gastrointestinal: Distended, non tender, and non distended with positive bowel sounds. No rebound or guarding. Musculoskeletal: Nontender with normal range of motion  in all extremities. No edema, cyanosis, or erythema of extremities. Neurologic: Normal speech and language. Face is symmetric. Moving all extremities. No gross focal neurologic deficits are appreciated. Skin: Skin is warm, dry and intact. No rash noted. Psychiatric: Mood and affect are normal. Speech and behavior are normal.  ____________________________________________   LABS (all labs ordered are listed, but only abnormal results are displayed)  Labs Reviewed  COMPREHENSIVE METABOLIC PANEL - Abnormal; Notable for the following:       Result Value     Albumin 3.1 (*)    AST 67 (*)    Total Bilirubin 1.5 (*)    All other components within normal limits  CBC WITH DIFFERENTIAL/PLATELET  LIPASE, BLOOD   ____________________________________________  EKG  none____________________________________________  RADIOLOGY  none  ____________________________________________   PROCEDURES  Procedure(s) performed:yes Procedures   Bedside US: large volume ascites in RUQ, LUQ, and suprapubic windows.  Critical Care performed:  None ____________________________________________   INITIAL IMPRESSION / ASSESSMENT AND PLAN / ED COURSE   56 y.o. male the history of hepatitis C and alcoholism complicated by cirrhosis and ascites who presents for evaluation of abdominal distention. Patient with medication noncompliance with his Lasix for a week. He has no abdominal pain, nausea, fever, tenderness, and normal white count therefore no clinical suspicion for SBP. His kidney function is within normal limits and so are his electrolytes. His vital signs are within normal limits and patient is satting 100% on room air with no oxygen requirement. His blood pressure and heart rate are also normal. Unfortunately because of the holiday today we do not have an intervention radiologist at [facility] and patient does not meet criteria for admission at this time. I spoke with Dr. Pascal Lux, radiologist on call who was able to schedule patient an appointment tomorrow at 9:30 AM with the radiology department for a large-volume paracentesis. Patient is comfortable with this plan and will be discharged home to return tomorrow at 9:30 AM. Recommended the patient return to the emergency room if he starts having abdominal pain or worsening shortness of breath.  Clinical Course     Pertinent labs & imaging results that were available during my care of the patient were reviewed by me and considered in my medical decision making (see chart for  details).    ____________________________________________   FINAL CLINICAL IMPRESSION(S) / ED DIAGNOSES  Final diagnoses:  Ascites      NEW MEDICATIONS STARTED DURING THIS VISIT:  New Prescriptions   No medications on file     Note:  This document was prepared using Dragon voice recognition software and may include unintentional dictation errors.    Rudene Re, MD 08/21/16 1254

## 2016-08-22 ENCOUNTER — Other Ambulatory Visit: Payer: Self-pay | Admitting: Gastroenterology

## 2016-08-22 ENCOUNTER — Ambulatory Visit
Admission: RE | Admit: 2016-08-22 | Discharge: 2016-08-22 | Disposition: A | Discharge status: Home / Self Care | Payer: Self-pay | Source: Ambulatory Visit | Attending: Nurse Practitioner | Admitting: Nurse Practitioner

## 2016-08-22 DIAGNOSIS — K7031 Alcoholic cirrhosis of liver with ascites: Secondary | ICD-10-CM | POA: Insufficient documentation

## 2016-08-22 DIAGNOSIS — B1920 Unspecified viral hepatitis C without hepatic coma: Secondary | ICD-10-CM

## 2016-08-22 LAB — BODY FLUID CELL COUNT WITH DIFFERENTIAL
Eos, Fluid: 0 %
Lymphs, Fluid: 24 %
MONOCYTE-MACROPHAGE-SEROUS FLUID: 73 %
Neutrophil Count, Fluid: 3 %
Total Nucleated Cell Count, Fluid: 241 cu mm

## 2016-08-22 LAB — PROTEIN, BODY FLUID

## 2016-08-22 LAB — PROTIME-INR
INR: 1.05
Prothrombin Time: 13.7 seconds (ref 11.4–15.2)

## 2016-08-22 LAB — ALBUMIN, FLUID (OTHER): Albumin, Fluid: 1 g/dL

## 2016-08-22 MED ORDER — ALBUMIN HUMAN 25 % IV SOLN
25.0000 g | Freq: Once | INTRAVENOUS | Status: AC
  Administered 2016-08-22: 25 g via INTRAVENOUS
  Filled 2016-08-22: qty 100

## 2016-08-22 NOTE — Procedures (Signed)
Under US guidance, paracentesis was performed. No immediate complication. Sample sent to lab.

## 2016-08-23 LAB — MISC LABCORP TEST (SEND OUT): Labcorp test code: 19588

## 2016-08-24 LAB — CYTOLOGY - NON PAP

## 2016-08-25 LAB — BODY FLUID CULTURE: CULTURE: NO GROWTH

## 2016-09-21 ENCOUNTER — Other Ambulatory Visit: Payer: Self-pay | Admitting: Gastroenterology

## 2016-09-21 DIAGNOSIS — B182 Chronic viral hepatitis C: Secondary | ICD-10-CM

## 2016-09-21 DIAGNOSIS — K7031 Alcoholic cirrhosis of liver with ascites: Secondary | ICD-10-CM

## 2016-09-25 ENCOUNTER — Ambulatory Visit
Admission: RE | Admit: 2016-09-25 | Discharge: 2016-09-25 | Disposition: A | Discharge status: Home / Self Care | Payer: Self-pay | Source: Ambulatory Visit | Attending: Gastroenterology | Admitting: Gastroenterology

## 2016-09-25 DIAGNOSIS — K7031 Alcoholic cirrhosis of liver with ascites: Secondary | ICD-10-CM | POA: Insufficient documentation

## 2016-09-25 DIAGNOSIS — B182 Chronic viral hepatitis C: Secondary | ICD-10-CM | POA: Insufficient documentation

## 2016-09-25 LAB — BODY FLUID CELL COUNT WITH DIFFERENTIAL
EOS FL: 0 %
Lymphs, Fluid: 37 %
Monocyte-Macrophage-Serous Fluid: 60 %
NEUTROPHIL FLUID: 3 %
OTHER CELLS FL: 0 %
Total Nucleated Cell Count, Fluid: 145 cu mm

## 2016-09-25 LAB — PROTEIN, BODY FLUID: Total protein, fluid: <3 g/dL

## 2016-09-25 LAB — ALBUMIN, FLUID (OTHER): ALBUMIN FL: 1.3 g/dL

## 2016-09-26 LAB — CYTOLOGY - NON PAP

## 2016-09-26 LAB — MISC LABCORP TEST (SEND OUT): Labcorp test code: 19588

## 2016-09-29 LAB — BODY FLUID CULTURE: Culture: NO GROWTH

## 2016-10-23 ENCOUNTER — Ambulatory Visit: Admit: 2016-10-23 | Payer: Self-pay | Admitting: Gastroenterology

## 2016-10-23 SURGERY — EGD (ESOPHAGOGASTRODUODENOSCOPY)
Anesthesia: General

## 2016-10-26 ENCOUNTER — Other Ambulatory Visit: Payer: Self-pay | Admitting: Gastroenterology

## 2016-10-26 DIAGNOSIS — K7030 Alcoholic cirrhosis of liver without ascites: Secondary | ICD-10-CM

## 2016-11-02 ENCOUNTER — Ambulatory Visit: Payer: Self-pay

## 2016-11-10 ENCOUNTER — Ambulatory Visit
Admission: RE | Admit: 2016-11-10 | Discharge: 2016-11-10 | Disposition: A | Discharge status: Home / Self Care | Payer: Self-pay | Source: Ambulatory Visit | Attending: Gastroenterology | Admitting: Gastroenterology

## 2016-11-10 DIAGNOSIS — K7460 Unspecified cirrhosis of liver: Secondary | ICD-10-CM | POA: Insufficient documentation

## 2016-11-10 DIAGNOSIS — R188 Other ascites: Secondary | ICD-10-CM | POA: Insufficient documentation

## 2016-11-10 DIAGNOSIS — K7030 Alcoholic cirrhosis of liver without ascites: Secondary | ICD-10-CM

## 2016-11-10 DIAGNOSIS — K8020 Calculus of gallbladder without cholecystitis without obstruction: Secondary | ICD-10-CM | POA: Insufficient documentation

## 2017-02-15 ENCOUNTER — Encounter: Payer: Self-pay | Admitting: *Deleted

## 2017-02-16 ENCOUNTER — Ambulatory Visit
Admission: RE | Admit: 2017-02-16 | Discharge: 2017-02-16 | Disposition: A | Discharge status: Home / Self Care | Payer: PRIVATE HEALTH INSURANCE | Source: Ambulatory Visit | Attending: Gastroenterology | Admitting: Gastroenterology

## 2017-02-16 ENCOUNTER — Ambulatory Visit: Payer: PRIVATE HEALTH INSURANCE | Admitting: Anesthesiology

## 2017-02-16 ENCOUNTER — Encounter
Admission: RE | Disposition: A | Discharge status: Home / Self Care | Payer: Self-pay | Source: Ambulatory Visit | Attending: Gastroenterology

## 2017-02-16 DIAGNOSIS — F1020 Alcohol dependence, uncomplicated: Secondary | ICD-10-CM | POA: Insufficient documentation

## 2017-02-16 DIAGNOSIS — I8510 Secondary esophageal varices without bleeding: Secondary | ICD-10-CM | POA: Diagnosis not present

## 2017-02-16 DIAGNOSIS — F329 Major depressive disorder, single episode, unspecified: Secondary | ICD-10-CM | POA: Diagnosis not present

## 2017-02-16 DIAGNOSIS — K2960 Other gastritis without bleeding: Secondary | ICD-10-CM | POA: Diagnosis not present

## 2017-02-16 DIAGNOSIS — B182 Chronic viral hepatitis C: Secondary | ICD-10-CM | POA: Insufficient documentation

## 2017-02-16 DIAGNOSIS — K7460 Unspecified cirrhosis of liver: Secondary | ICD-10-CM | POA: Insufficient documentation

## 2017-02-16 DIAGNOSIS — Z1211 Encounter for screening for malignant neoplasm of colon: Secondary | ICD-10-CM | POA: Diagnosis present

## 2017-02-16 HISTORY — DX: Alcohol dependence, uncomplicated: F10.20

## 2017-02-16 HISTORY — PX: ESOPHAGOGASTRODUODENOSCOPY (EGD) WITH PROPOFOL: SHX5813

## 2017-02-16 HISTORY — PX: COLONOSCOPY WITH PROPOFOL: SHX5780

## 2017-02-16 LAB — PROTIME-INR
INR: 1
PROTHROMBIN TIME: 13.2 s (ref 11.4–15.2)

## 2017-02-16 LAB — CBC WITH DIFFERENTIAL/PLATELET
BASOS PCT: 1 %
Basophils Absolute: 0 10*3/uL (ref 0–0.1)
EOS ABS: 0 10*3/uL (ref 0–0.7)
Eosinophils Relative: 1 %
HCT: 38 % — ABNORMAL LOW (ref 40.0–52.0)
HEMOGLOBIN: 13.1 g/dL (ref 13.0–18.0)
LYMPHS ABS: 1.4 10*3/uL (ref 1.0–3.6)
Lymphocytes Relative: 21 %
MCH: 32.3 pg (ref 26.0–34.0)
MCHC: 34.4 g/dL (ref 32.0–36.0)
MCV: 93.9 fL (ref 80.0–100.0)
MONO ABS: 1.1 10*3/uL — AB (ref 0.2–1.0)
MONOS PCT: 16 %
Neutro Abs: 4 10*3/uL (ref 1.4–6.5)
Neutrophils Relative %: 61 %
Platelets: 161 10*3/uL (ref 150–440)
RBC: 4.05 MIL/uL — ABNORMAL LOW (ref 4.40–5.90)
RDW: 14.1 % (ref 11.5–14.5)
WBC: 6.5 10*3/uL (ref 3.8–10.6)

## 2017-02-16 SURGERY — COLONOSCOPY WITH PROPOFOL
Anesthesia: General

## 2017-02-16 MED ORDER — SODIUM CHLORIDE 0.9 % IV SOLN: INTRAVENOUS | Status: DC

## 2017-02-16 MED ORDER — GLYCOPYRROLATE 0.2 MG/ML IJ SOLN: INTRAMUSCULAR | Status: DC | PRN

## 2017-02-16 MED ORDER — SODIUM CHLORIDE 0.9 % IV SOLN
INTRAVENOUS | Status: DC
  Administered 2017-02-16 (×2): via INTRAVENOUS

## 2017-02-16 MED ORDER — PROPOFOL 500 MG/50ML IV EMUL
INTRAVENOUS | Status: AC
  Filled 2017-02-16: qty 50

## 2017-02-16 MED ORDER — PROPOFOL 500 MG/50ML IV EMUL
INTRAVENOUS | Status: DC | PRN
  Administered 2017-02-16: 150 ug/kg/min via INTRAVENOUS

## 2017-02-16 MED ORDER — PROPOFOL 10 MG/ML IV BOLUS
INTRAVENOUS | Status: DC | PRN
  Administered 2017-02-16: 80 mg via INTRAVENOUS
  Administered 2017-02-16: 20 mg via INTRAVENOUS

## 2017-02-16 MED ORDER — LIDOCAINE HCL (CARDIAC) 20 MG/ML IV SOLN
INTRAVENOUS | Status: DC | PRN
  Administered 2017-02-16: 40 mg via INTRAVENOUS
  Administered 2017-02-16: 60 mg via INTRAVENOUS

## 2017-02-16 NOTE — H&P (Signed)
Outpatient short stay form Pre-procedure 02/16/2017 3:56 PM Lollie Sails MD  Primary Physician: No primary care physician  Reason for visit:  EGD and colonoscopy  History of present illness:  Patient is a 56 year old male presenting today as above. He has a history of ascites with cirrhosis of liver likely secondary to combination of alcohol use and chronic hepatitis C. He is presenting today for an upper scope to check for varices and a colon cancer screening. ProTime and CBC was checked today. White count was 6.5 platelet count 161, INR is 1.0. He tolerated his prep well. He takes no blood thinners or aspirin products.     Current Facility-Administered Medications:  .  0.9 %  sodium chloride infusion, , Intravenous, Continuous, Lollie Sails, MD, Last Rate: 20 mL/hr at 02/16/17 1324 .  0.9 %  sodium chloride infusion, , Intravenous, Continuous, Lollie Sails, MD  Prescriptions Prior to Admission  Medication Sig Dispense Refill Last Dose  . folic acid (FOLVITE) 161 MCG tablet Take 400 mcg by mouth daily.   02/15/2017 at Unknown time  . omeprazole (PRILOSEC) 20 MG capsule Take 20 mg by mouth daily.   02/15/2017 at Unknown time  . potassium chloride SA (K-DUR,KLOR-CON) 20 MEQ tablet Take 20 mEq by mouth 2 (two) times daily.   02/15/2017 at Unknown time  . spironolactone (ALDACTONE) 25 MG tablet Take 25 mg by mouth daily.   02/15/2017 at Unknown time  . thiamine (VITAMIN B-1) 100 MG tablet Take 100 mg by mouth daily.   02/15/2017 at Unknown time  . ciprofloxacin (CIPRO) 500 MG tablet Take 500 mg by mouth 2 (two) times daily.   Completed Course at Unknown time  . Ledipasvir-Sofosbuvir (HARVONI) 90-400 MG TABS Take by mouth.   Not Taking at Unknown time  . oxyCODONE (ROXICODONE) 5 MG immediate release tablet Take 1 tablet (5 mg total) by mouth every 8 (eight) hours as needed. (Patient not taking: Reported on 02/16/2017) 10 tablet 0 Completed Course at Unknown time     No Known  Allergies   Past Medical History:  Diagnosis Date  . Alcoholism ([place])   . Hep C w/ coma, chronic (HCC)     Review of systems:      Physical Exam    Heart and lungs: Regular rate and rhythm without rub or gallop, lungs are bilaterally clear.   HEENT: Normal cephalic atraumatic eyes are anicteric   Other:     Pertinant exam for procedure: Soft nontender nondistended bowel sounds positive normoactive. There is no ascites currently.   Planned proceedures: EGD, colonoscopy and indicated procedures. I have discussed the risks benefits and complications of procedures to include not limited to bleeding, infection, perforation and the risk of sedation and the patient wishes to proceed.   Lollie Sails, MD Gastroenterology 02/16/2017  3:56 PM

## 2017-02-16 NOTE — Op Note (Signed)
[facility] Gastroenterology Patient Name: Johnny [name] Procedure Date: 02/16/2017 3:57 PM MRN: [id number] Account #: [id number] Date of Birth: 12-26-1960 Admit Type: Outpatient Age: 56 Room: [facility] ENDO ROOM 1 Gender: Male Note Status: Finalized Procedure:            Colonoscopy Indications:          Screening for colorectal malignant neoplasm Providers:            Lollie Sails, MD Medicines:            Monitored Anesthesia Care Complications:        No immediate complications. Procedure:            Pre-Anesthesia Assessment:                       - ASA Grade Assessment: III - A patient with severe                        systemic disease.                       After obtaining informed consent, the colonoscope was                        passed under direct vision. Throughout the procedure,                        the patient's blood pressure, pulse, and oxygen                        saturations were monitored continuously. The                        Colonoscope was introduced through the anus and                        advanced to the the cecum, identified by appendiceal                        orifice and ileocecal valve. The colonoscopy was                        performed without difficulty. The patient tolerated the                        procedure well. The quality of the bowel preparation                        was fair. Findings:      rectal varices      The exam was otherwise normal throughout the examined colon.      The digital rectal exam was normal. Impression:           - Preparation of the colon was fair.                       - No specimens collected. Recommendation:       - Discharge patient to home.                       - Low residue diet today, then advance as tolerated to  advance diet as tolerated. Procedure Code(s):    --- Professional ---                       [phone], Colonoscopy, flexible; diagnostic,  including                        collection of specimen(s) by brushing or washing, when                        performed (separate procedure) Diagnosis Code(s):    --- Professional ---                       Z12.11, Encounter for screening for malignant neoplasm                        of colon CPT copyright 2016 American Medical Association. All rights reserved. The codes documented in this report are preliminary and upon coder review may  be revised to meet current compliance requirements. Lollie Sails, MD 02/16/2017 4:59:28 PM This report has been signed electronically. Number of Addenda: 0 Note Initiated On: 02/16/2017 3:57 PM Scope Withdrawal Time: 0 hours 6 minutes 52 seconds  Total Procedure Duration: 0 hours 16 minutes 42 seconds       [facility]

## 2017-02-16 NOTE — Transfer of Care (Signed)
Immediate Anesthesia Transfer of Care Note  Patient: Johnny [name]  Procedure(s) Performed: Procedure(s): COLONOSCOPY WITH PROPOFOL (N/A) ESOPHAGOGASTRODUODENOSCOPY (EGD) WITH PROPOFOL (N/A)  Patient Location: PACU  Anesthesia Type:General  Level of Consciousness: awake and sedated  Airway & Oxygen Therapy: Patient Spontanous Breathing and Patient connected to nasal cannula oxygen  Post-op Assessment: Report given to RN and Post -op Vital signs reviewed and stable  Post vital signs: Reviewed and stable  Last Vitals:  Vitals:   02/16/17 1306 02/16/17 1702  BP: 135/85 130/65  Pulse: 76 (!) 48  Resp: 18 16  Temp: 36.2 C (!) 36 C    Last Pain:  Vitals:   02/16/17 1702  TempSrc: Tympanic         Complications: No apparent anesthesia complications

## 2017-02-16 NOTE — Anesthesia Post-op Follow-up Note (Cosign Needed)
Anesthesia QCDR form completed.        

## 2017-02-16 NOTE — Anesthesia Procedure Notes (Signed)
Date/Time: 02/16/2017 4:32 PM Performed by: Nelda Marseille Pre-anesthesia Checklist: Patient identified, Emergency Drugs available, Suction available, Patient being monitored and Timeout performed Oxygen Delivery Method: Nasal cannula

## 2017-02-16 NOTE — Op Note (Addendum)
[facility] Gastroenterology Patient Name: Johnny [name] Procedure Date: 02/16/2017 3:59 PM MRN: [id number] Account #: [id number] Date of Birth: [birth year] Admit Type: Outpatient Age: 56 Room: [facility] ENDO ROOM 1 Gender: Male Note Status: Finalized Procedure:            Upper GI endoscopy Indications:          Cirrhosis with suspected esophageal varices Providers:            Lollie Sails, MD Medicines:            Monitored Anesthesia Care Complications:        No immediate complications. Procedure:            Pre-Anesthesia Assessment:                       - ASA Grade Assessment: III - A patient with severe                        systemic disease.                       After obtaining informed consent, the endoscope was                        passed under direct vision. Throughout the procedure,                        the patient's blood pressure, pulse, and oxygen                        saturations were monitored continuously. The Endoscope                        was introduced through the mouth, and advanced to the                        third part of duodenum. The upper GI endoscopy was                        accomplished without difficulty. The patient tolerated                        the procedure well. Findings:      Small (< 5 mm) varices were found in the lower third of the esophagus.       They were 3-4 mm in largest diameter.      Grade II varices were found in the lower third of the esophagus.      Patchy mild inflammation characterized by congestion (edema), erosions       and erythema was found in the gastric antrum. Biopsies were taken with a       cold forceps for histology. Biopsies were taken with a cold forceps for       Helicobacter pylori testing.      Two 3 to 6 mm mucosal papules (nodules) with no bleeding and no stigmata       of recent bleeding were found in the gastric antrum. Biopsies were taken       with a cold forceps for  histology.      The cardia and gastric fundus were normal on retroflexion.      The in the duodenum  was normal. Impression:           - Small (< 5 mm) esophageal varices.                       - Grade II esophageal varices.                       - Erosive gastritis. Biopsied.                       - Two mucosal papules (nodules) found in the stomach.                        Biopsied.                       - Normal. Recommendation:       - Give a beta blocker with dosage titrated by the heart                        rate.                       - Await pathology results.                       - Return to GI clinic in 2 weeks.                       - repeat EGD in 6 months to reevaluate varices,                        possible banding. Procedure Code(s):    --- Professional ---                       [phone], Esophagogastroduodenoscopy, flexible, transoral;                        with biopsy, single or multiple Diagnosis Code(s):    --- Professional ---                       K74.60, Unspecified cirrhosis of liver                       I85.10, Secondary esophageal varices without bleeding                       K29.60, Other gastritis without bleeding                       K31.89, Other diseases of stomach and duodenum CPT copyright 2016 American Medical Association. All rights reserved. The codes documented in this report are preliminary and upon coder review may  be revised to meet current compliance requirements. Lollie Sails, MD 02/16/2017 4:35:17 PM This report has been signed electronically. Number of Addenda: 0 Note Initiated On: 02/16/2017 3:59 PM      [facility]

## 2017-02-16 NOTE — Anesthesia Preprocedure Evaluation (Addendum)
Anesthesia Evaluation  Patient identified by MRN, date of birth, ID band Patient awake    Reviewed: Allergy & Precautions, NPO status , Patient's Chart, lab work & pertinent test results, reviewed documented beta blocker date and time   Airway Mallampati: II  TM Distance: >3 FB     Dental  (+) Chipped, Upper Dentures, Partial Lower   Pulmonary           Cardiovascular      Neuro/Psych PSYCHIATRIC DISORDERS Depression    GI/Hepatic (+) Hepatitis -, C  Endo/Other    Renal/GU      Musculoskeletal   Abdominal   Peds  Hematology   Anesthesia Other Findings Hx of ETOH.  Reproductive/Obstetrics                            Anesthesia Physical Anesthesia Plan  ASA: III  Anesthesia Plan: General   Post-op Pain Management:    Induction: Intravenous  PONV Risk Score and Plan:   Airway Management Planned:   Additional Equipment:   Intra-op Plan:   Post-operative Plan:   Informed Consent: I have reviewed the patients History and Physical, chart, labs and discussed the procedure including the risks, benefits and alternatives for the proposed anesthesia with the patient or authorized representative who has indicated his/her understanding and acceptance.     Plan Discussed with: CRNA  Anesthesia Plan Comments:         Anesthesia Quick Evaluation

## 2017-02-19 ENCOUNTER — Encounter: Payer: Self-pay | Admitting: Gastroenterology

## 2017-02-19 NOTE — Anesthesia Postprocedure Evaluation (Signed)
Anesthesia Post Note  Patient: CORDARIOUS ZEEK  Procedure(s) Performed: Procedure(s) (LRB): COLONOSCOPY WITH PROPOFOL (N/A) ESOPHAGOGASTRODUODENOSCOPY (EGD) WITH PROPOFOL (N/A)  Patient location during evaluation: PACU Anesthesia Type: General Level of consciousness: awake and alert Pain management: pain level controlled Vital Signs Assessment: post-procedure vital signs reviewed and stable Respiratory status: spontaneous breathing, nonlabored ventilation, respiratory function stable and patient connected to nasal cannula oxygen Cardiovascular status: blood pressure returned to baseline and stable Postop Assessment: no signs of nausea or vomiting Anesthetic complications: no     Last Vitals:  Vitals:   02/16/17 1712 02/16/17 1732  BP: 112/90 132/82  Pulse: 65 63  Resp: 18 18  Temp:      Last Pain:  Vitals:   02/16/17 1702  TempSrc: Tympanic                 Molli Barrows

## 2017-02-23 LAB — SURGICAL PATHOLOGY

## 2017-04-02 ENCOUNTER — Other Ambulatory Visit: Payer: Self-pay | Admitting: Gastroenterology

## 2017-04-02 DIAGNOSIS — K7460 Unspecified cirrhosis of liver: Secondary | ICD-10-CM

## 2017-04-02 DIAGNOSIS — R772 Abnormality of alphafetoprotein: Secondary | ICD-10-CM

## 2017-04-02 DIAGNOSIS — B1920 Unspecified viral hepatitis C without hepatic coma: Secondary | ICD-10-CM

## 2017-04-05 ENCOUNTER — Telehealth: Payer: Self-pay | Admitting: Pharmacy Technician

## 2017-04-05 NOTE — Telephone Encounter (Signed)
Patient has pharmacy coverage with [employer].  No additional medication assistance will be provided by [facility], since patient has prescription drug coverage.  Patient notified by letter.  [facility]

## 2017-04-06 ENCOUNTER — Ambulatory Visit
Admission: RE | Admit: 2017-04-06 | Discharge: 2017-04-06 | Disposition: A | Discharge status: Home / Self Care | Payer: PRIVATE HEALTH INSURANCE | Source: Ambulatory Visit | Attending: Gastroenterology | Admitting: Gastroenterology

## 2017-04-06 DIAGNOSIS — B1920 Unspecified viral hepatitis C without hepatic coma: Secondary | ICD-10-CM | POA: Diagnosis present

## 2017-04-06 DIAGNOSIS — K7460 Unspecified cirrhosis of liver: Secondary | ICD-10-CM

## 2017-04-06 DIAGNOSIS — K8020 Calculus of gallbladder without cholecystitis without obstruction: Secondary | ICD-10-CM | POA: Insufficient documentation

## 2017-04-06 DIAGNOSIS — R188 Other ascites: Secondary | ICD-10-CM | POA: Insufficient documentation

## 2017-04-06 DIAGNOSIS — R772 Abnormality of alphafetoprotein: Secondary | ICD-10-CM | POA: Diagnosis not present

## 2017-04-06 MED ORDER — GADOBENATE DIMEGLUMINE 529 MG/ML IV SOLN
10.0000 mL | Freq: Once | INTRAVENOUS | Status: AC | PRN
  Administered 2017-04-06: 10 mL via INTRAVENOUS

## 2017-04-11 ENCOUNTER — Other Ambulatory Visit: Payer: Self-pay | Admitting: Gastroenterology

## 2017-04-11 DIAGNOSIS — K7031 Alcoholic cirrhosis of liver with ascites: Secondary | ICD-10-CM

## 2017-04-12 ENCOUNTER — Ambulatory Visit: Payer: PRIVATE HEALTH INSURANCE

## 2017-04-13 ENCOUNTER — Ambulatory Visit
Admission: RE | Admit: 2017-04-13 | Discharge: 2017-04-13 | Disposition: A | Discharge status: Home / Self Care | Payer: PRIVATE HEALTH INSURANCE | Source: Ambulatory Visit | Attending: Gastroenterology | Admitting: Gastroenterology

## 2017-04-13 DIAGNOSIS — K7031 Alcoholic cirrhosis of liver with ascites: Secondary | ICD-10-CM | POA: Diagnosis not present

## 2017-04-13 LAB — BODY FLUID CELL COUNT WITH DIFFERENTIAL
EOS FL: 0 %
LYMPHS FL: 20 %
MONOCYTE-MACROPHAGE-SEROUS FLUID: 75 %
NEUTROPHIL FLUID: 5 %
OTHER CELLS FL: 0 %
Total Nucleated Cell Count, Fluid: 258 cu mm

## 2017-04-13 LAB — ALBUMIN, PLEURAL OR PERITONEAL FLUID: Albumin, Fluid: 1.8 g/dL

## 2017-04-13 LAB — PROTEIN, PLEURAL OR PERITONEAL FLUID: Total protein, fluid: 3.1 g/dL

## 2017-04-13 NOTE — OR Nursing (Signed)
Pt refusing albumin, Butch Penny at [place] [facility] to inform Massie Bougie PA who ordered it.

## 2017-04-13 NOTE — Procedures (Signed)
US paracentesis without difficulty  Complications:  None  Blood Loss: none  See dictation in canopy pacs  

## 2017-04-16 ENCOUNTER — Ambulatory Visit: Payer: PRIVATE HEALTH INSURANCE

## 2017-04-16 LAB — CYTOLOGY - NON PAP

## 2017-04-17 ENCOUNTER — Ambulatory Visit: Payer: PRIVATE HEALTH INSURANCE

## 2017-04-17 LAB — BODY FLUID CULTURE: Culture: NO GROWTH

## 2017-04-20 ENCOUNTER — Other Ambulatory Visit: Payer: PRIVATE HEALTH INSURANCE

## 2017-07-27 ENCOUNTER — Other Ambulatory Visit: Payer: Self-pay | Admitting: Gastroenterology

## 2017-07-27 DIAGNOSIS — K7031 Alcoholic cirrhosis of liver with ascites: Secondary | ICD-10-CM

## 2017-07-27 DIAGNOSIS — B182 Chronic viral hepatitis C: Secondary | ICD-10-CM

## 2017-07-31 NOTE — Discharge Instructions (Signed)
Paracentesis, Care After °Refer to this sheet in the next few weeks. These instructions provide you with information about caring for yourself after your procedure. Your health care provider may also give you more specific instructions. Your treatment has been planned according to current medical practices, but problems sometimes occur. Call your health care provider if you have any problems or questions after your procedure. °What can I expect after the procedure? °After your procedure, it is common to have a small amount of clear fluid coming from the puncture site. °Follow these instructions at home: °· Return to your normal activities as told by your health care provider. Ask your health care provider what activities are safe for you. °· Take over-the-counter and prescription medicines only as told by your health care provider. °· Do not take baths, swim, or use a hot tub until your health care provider approves. °· Follow instructions from your health care provider about: °? How to take care of your puncture site. °? When and how you should change your bandage (dressing). °? When you should remove your dressing. °· Check your puncture area every day signs of infection. Watch for: °? Redness, swelling, or pain. °? Fluid, blood, or pus. °· Keep all follow-up visits as told by your health care provider. This is important. °Contact a health care provider if: °· You have redness, swelling, or pain at your puncture site. °· You start to have more clear fluid coming from your puncture site. °· You have blood or pus coming from your puncture site. °· You have chills. °· You have a fever. °Get help right away if: °· You develop chest pain or shortness of breath. °· You develop increasing pain, discomfort, or swelling in your abdomen. °· You feel dizzy or light-headed or you pass out. °This information is not intended to replace advice given to you by your health care provider. Make sure you discuss any questions you  have with your health care provider. °Document Released: 12/22/2014 Document Revised: 01/13/2016 Document Reviewed: 10/20/2014 °Elsevier Interactive Patient Education © 2018 [employer] ° °

## 2017-08-03 ENCOUNTER — Ambulatory Visit
Admission: RE | Admit: 2017-08-03 | Discharge: 2017-08-03 | Disposition: A | Discharge status: Home / Self Care | Payer: PRIVATE HEALTH INSURANCE | Source: Ambulatory Visit | Attending: Gastroenterology | Admitting: Gastroenterology

## 2017-08-03 DIAGNOSIS — K7031 Alcoholic cirrhosis of liver with ascites: Secondary | ICD-10-CM | POA: Diagnosis present

## 2017-08-03 DIAGNOSIS — B182 Chronic viral hepatitis C: Secondary | ICD-10-CM | POA: Insufficient documentation

## 2017-08-03 LAB — BODY FLUID CELL COUNT WITH DIFFERENTIAL
LYMPHS FL: 28 %
Monocyte-Macrophage-Serous Fluid: 70 %
NEUTROPHIL FLUID: 2 %
Total Nucleated Cell Count, Fluid: 150 cu mm

## 2017-08-03 LAB — ALBUMIN, PLEURAL OR PERITONEAL FLUID: Albumin, Fluid: 1.8 g/dL

## 2017-08-03 LAB — PROTEIN, PLEURAL OR PERITONEAL FLUID: Total protein, fluid: 3.3 g/dL

## 2017-08-03 NOTE — Procedures (Signed)
PROCEDURE SUMMARY:  Successful US guided paracentesis from right lateral abdomen.  Yielded 6.1 liters of clear yellow fluid.  No immediate complications.  Pt tolerated well.    Myan Locatelli S Sloan Galentine PA-C 08/03/2017 9:52 AM

## 2017-08-06 LAB — CYTOLOGY - NON PAP

## 2017-08-08 LAB — AEROBIC/ANAEROBIC CULTURE (SURGICAL/DEEP WOUND): CULTURE: NO GROWTH

## 2017-08-08 LAB — AEROBIC/ANAEROBIC CULTURE W GRAM STAIN (SURGICAL/DEEP WOUND)

## 2017-10-02 ENCOUNTER — Other Ambulatory Visit: Payer: Self-pay | Admitting: Gastroenterology

## 2017-10-02 DIAGNOSIS — K7460 Unspecified cirrhosis of liver: Secondary | ICD-10-CM

## 2017-10-02 DIAGNOSIS — R188 Other ascites: Principal | ICD-10-CM

## 2017-10-03 ENCOUNTER — Other Ambulatory Visit: Payer: Self-pay | Admitting: Internal Medicine

## 2017-10-05 ENCOUNTER — Ambulatory Visit (HOSPITAL_COMMUNITY): Payer: PRIVATE HEALTH INSURANCE

## 2017-10-05 ENCOUNTER — Ambulatory Visit
Admission: RE | Admit: 2017-10-05 | Discharge: 2017-10-05 | Disposition: A | Discharge status: Home / Self Care | Payer: PRIVATE HEALTH INSURANCE | Source: Ambulatory Visit | Attending: Gastroenterology | Admitting: Gastroenterology

## 2017-10-05 ENCOUNTER — Ambulatory Visit: Payer: PRIVATE HEALTH INSURANCE

## 2017-10-05 DIAGNOSIS — R188 Other ascites: Secondary | ICD-10-CM | POA: Insufficient documentation

## 2017-10-05 DIAGNOSIS — K7460 Unspecified cirrhosis of liver: Secondary | ICD-10-CM | POA: Insufficient documentation

## 2017-10-05 LAB — ALBUMIN, PLEURAL OR PERITONEAL FLUID: Albumin, Fluid: 1.4 g/dL

## 2017-10-05 LAB — BODY FLUID CELL COUNT WITH DIFFERENTIAL
Eos, Fluid: 0 %
LYMPHS FL: 23 %
Monocyte-Macrophage-Serous Fluid: 77 %
NEUTROPHIL FLUID: 0 %
Other Cells, Fluid: 0 %
WBC FLUID: 76 uL

## 2017-10-05 LAB — PROTEIN, PLEURAL OR PERITONEAL FLUID

## 2017-10-05 NOTE — Progress Notes (Signed)
Patient refused albumin when having paracentesis.

## 2017-10-05 NOTE — Procedures (Signed)
PROCEDURE SUMMARY:  Successful US guided paracentesis from right lateral abdomen.  Yielded 7.5 liters of clear, yellow fluid.  No immediate complications.  Pt tolerated well.   Specimen was sent for labs.  Docia Barrier PA-C 10/05/2017 9:35 AM

## 2017-10-06 LAB — PROTEIN, BODY FLUID (OTHER): Total Protein, Body Fluid Other: 2.8 g/dL

## 2017-10-08 LAB — CYTOLOGY - NON PAP

## 2017-10-08 LAB — BODY FLUID CULTURE: CULTURE: NO GROWTH

## 2017-11-20 ENCOUNTER — Ambulatory Visit: Payer: Self-pay | Admitting: Pharmacy Technician

## 2017-11-20 ENCOUNTER — Encounter (INDEPENDENT_AMBULATORY_CARE_PROVIDER_SITE_OTHER): Payer: Self-pay

## 2017-11-20 DIAGNOSIS — Z79899 Other long term (current) drug therapy: Secondary | ICD-10-CM

## 2017-11-20 NOTE — Progress Notes (Signed)
Completed [facility] application and contract.  Patient agreed to all terms of the [facility] contract.    Patient approved to receive medication assistance at [facility] through 2019, as long as eligibility criteria continues to be met.    Provided patient with community resource material based on his particular needs.    Siani Utke J. Khyler Urda Care Manager [facility]  

## 2017-11-28 ENCOUNTER — Ambulatory Visit: Payer: Self-pay | Admitting: Pharmacist

## 2017-11-28 ENCOUNTER — Other Ambulatory Visit: Payer: Self-pay

## 2017-11-28 ENCOUNTER — Encounter: Payer: Self-pay | Admitting: Pharmacist

## 2017-11-28 VITALS — BP 140/70 | Ht 67.0 in | Wt 136.0 lb

## 2017-11-28 DIAGNOSIS — Z79899 Other long term (current) drug therapy: Secondary | ICD-10-CM

## 2017-11-28 NOTE — Progress Notes (Addendum)
[facility] Visit Note  Patient: Johnny [name] MRN: [id number] Date of Birth: 15-Sep-1960 PCP: Patient, No Pcp Per   Johnny [name] 57 y.o. male presents with his pill bottles for a medication therapy managment visit with the pharmacist today.  BP 140/70 (BP Location: Right Arm, Patient Position: Sitting, Cuff Size: Small)   Ht 5\' 7"  (1.702 m)   Wt 136 lb (61.7 kg)   BMI 21.30 kg/m   Patient Information   Past Medical History:  Diagnosis Date  . Alcoholism ([place])   . Hep C w/ coma, chronic ([facility])       Past Surgical History:  Procedure Laterality Date  . COLONOSCOPY WITH PROPOFOL N/A 02/16/2017   Procedure: COLONOSCOPY WITH PROPOFOL;  Surgeon: Lollie Sails, MD;  Location: [facility] ENDOSCOPY;  Service: Endoscopy;  Laterality: N/A;  . ESOPHAGOGASTRODUODENOSCOPY (EGD) WITH PROPOFOL N/A 02/16/2017   Procedure: ESOPHAGOGASTRODUODENOSCOPY (EGD) WITH PROPOFOL;  Surgeon: Lollie Sails, MD;  Location: [facility] ENDOSCOPY;  Service: Endoscopy;  Laterality: N/A;    No family history on file.  New Diagnoses (since last visit): NA  Family Support: Poor  Lifestyle Diet: Usually eats twice daily, aware that he should limit sodium intake  Breakfast: sandwich Dinner: frozen dinners  Drinks: regular sodas            Social History   Substance and Sexual Activity  Alcohol Use Yes      Social History   Tobacco Use  Smoking Status Never Smoker  Smokeless Tobacco Never Used      Health Maintenance  Topic Date Due  . HIV Screening  10/18/1973  . TETANUS/TDAP  10/18/1977  . INFLUENZA VACCINE  03/21/2018  . COLONOSCOPY  02/17/2027  . Hepatitis C Screening  Completed   Outpatient Encounter Medications as of 11/28/2017  Medication Sig  . ciprofloxacin (CIPRO) 500 MG tablet Take 500 mg by mouth 2 (two) times daily.  . folic acid (FOLVITE) 573 MCG tablet Take 400 mcg by mouth daily.  Marland Kitchen omeprazole (PRILOSEC) 20 MG capsule Take 20 mg by mouth daily.   . potassium chloride SA (K-DUR,KLOR-CON) 20 MEQ tablet Take 20 mEq by mouth 2 (two) times daily.  . Sofosbuvir-Velpatasvir (EPCLUSA) 400-100 MG TABS Take 1 tablet by mouth daily.  Marland Kitchen spironolactone (ALDACTONE) 25 MG tablet Take 25 mg by mouth daily.  Marland Kitchen thiamine (VITAMIN B-1) 100 MG tablet Take 100 mg by mouth daily.  . [DISCONTINUED] Ledipasvir-Sofosbuvir (HARVONI) 90-400 MG TABS Take by mouth.   No facility-administered encounter medications on file as of 11/28/2017.     Assessment and Plan:  Medication Compliance: Patient is able to correctly state the names of his medications, why he is taking them and when he takes them. States that he does not use a pill box but is adherent to his medication regimen and prefers to not use a pill box. Patient asked if he would be able to have his Viagra filled here as well and was informed of the patient assistance program for Viagra and will have his PCP send a prescription here.   HCV: Patient is currently taking Epclusa. He has been on this for ~2 months and he tolerates this well.   Cirrhosis: Patient is currently taking ciprofloxacin, omeprazole, spironolactone and potassium. His recent notes from GI indicate that he should also be taking nadolol, furosemide, and lactulose. Patient states that he no longer takes the lactulose due to frequent bowl movements, but is unsure if he should be on furosemide or nadolol. Communication  will be sent to GI to clarify medication regimen.    RTC: 1 year   Lewie Loron, PharmD Candidate   Cosigned:Christan Helene Kelp, PharmD, [facility]) [phone]

## 2017-12-30 IMAGING — US US PARACENTESIS
1 series · 13 of 13 positions shown · non-contrast
Comparison: Ultrasound-guided paracentesis - 03/03/2016;

INDICATION: History of hepatitis-C and cirrhosis, now with recurrent symptomatic
ascites. Please perform ultrasound-guided paracentesis for
therapeutic and diagnostic purposes.

EXAM:
ULTRASOUND-GUIDED PARACENTESIS
TECHNIQUE: Informed written consent was obtained from the patient after a
discussion of the risks, benefits and alternatives to treatment. A
timeout was performed prior to the initiation of the procedure.

[Series 1: us paracentesis · 0.25mm/px · 13 of 13 slices shown]
[im 1/13]
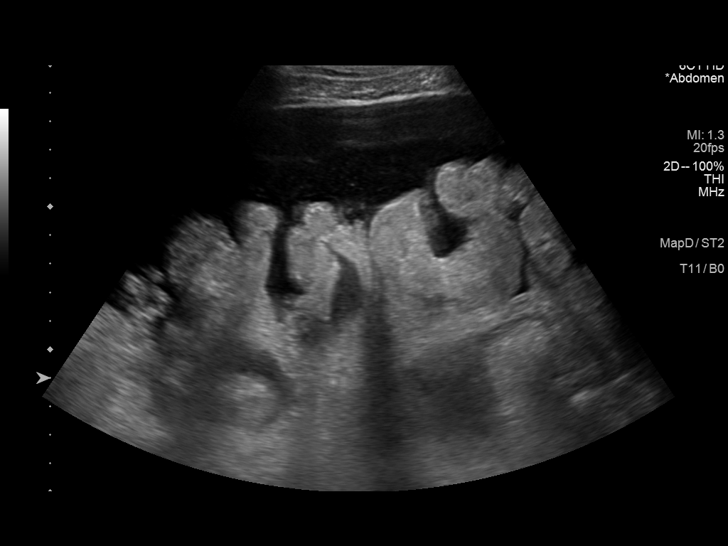
[im 2/13]
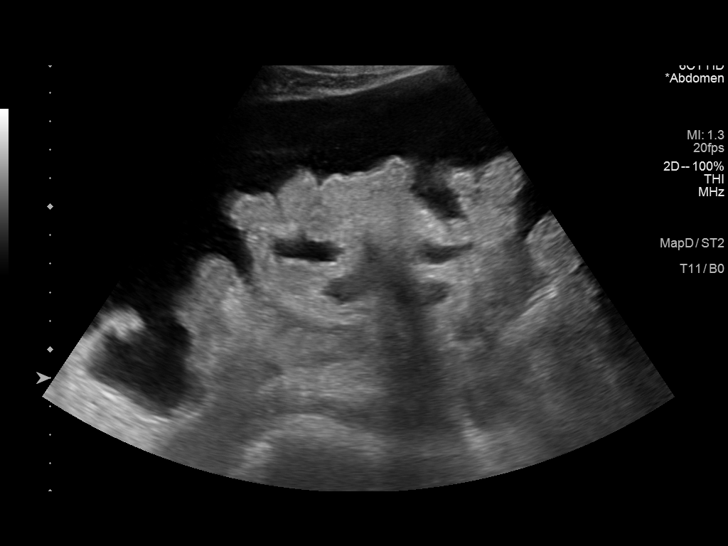
[im 3/13]
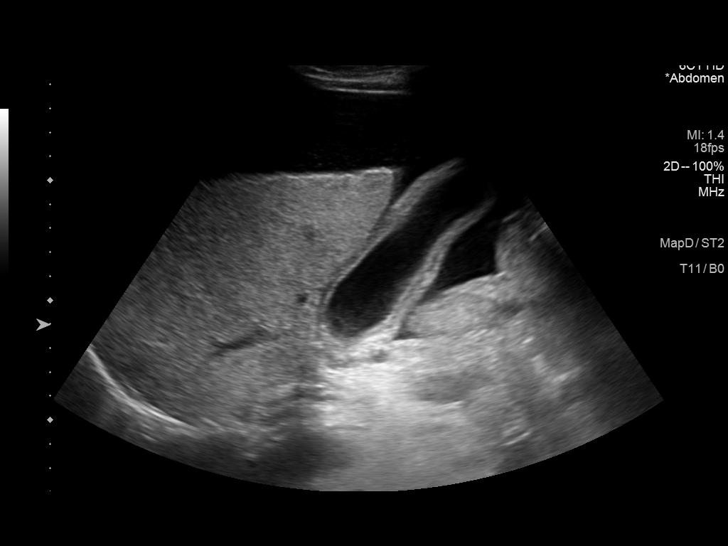
[im 4/13]
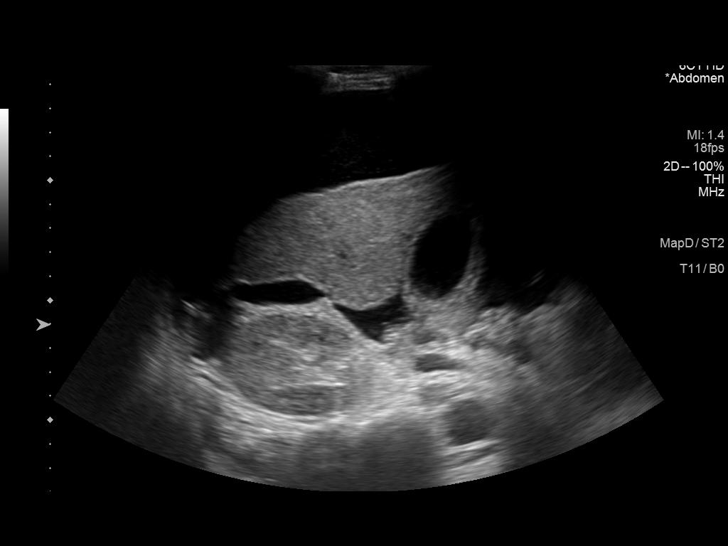
[im 5/13]
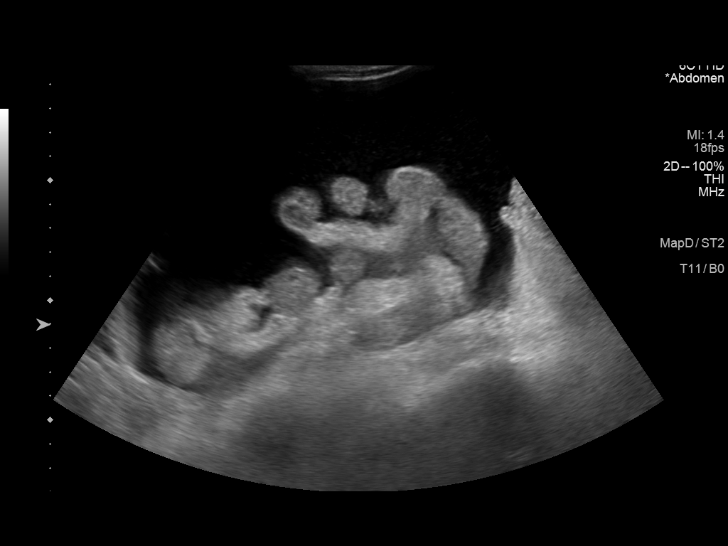
[im 6/13]
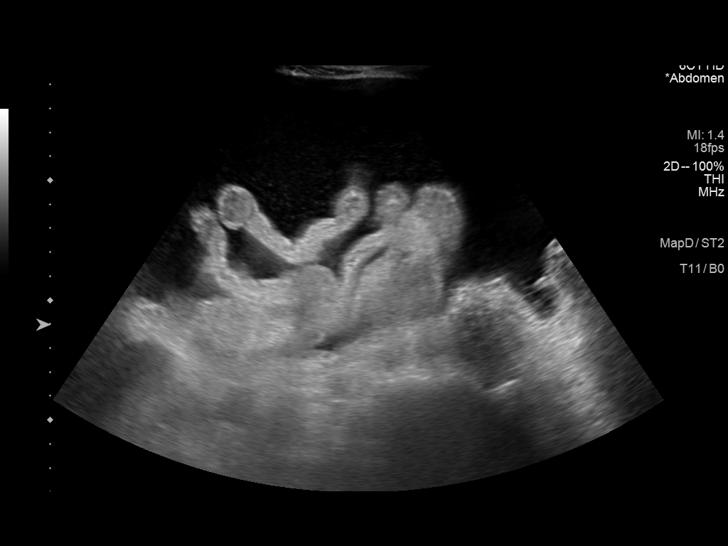
[im 7/13]
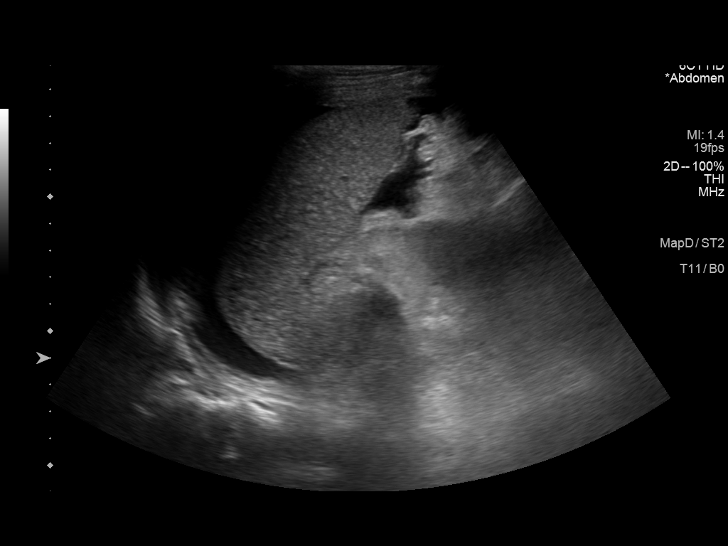
[im 8/13]
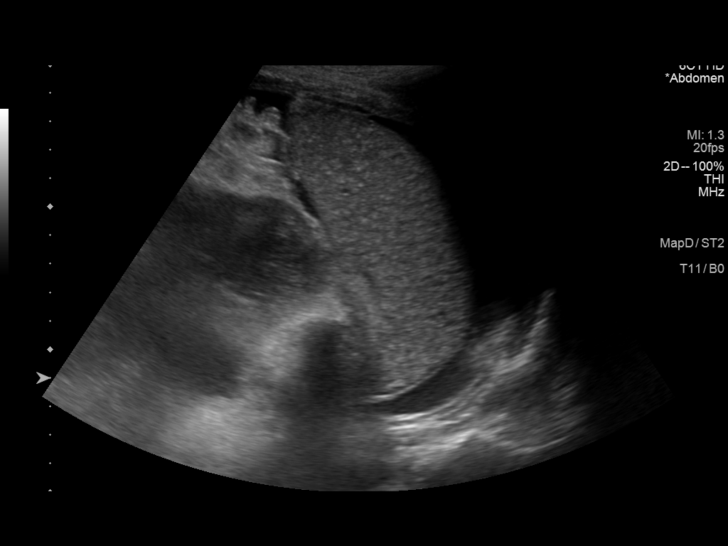
[im 9/13]
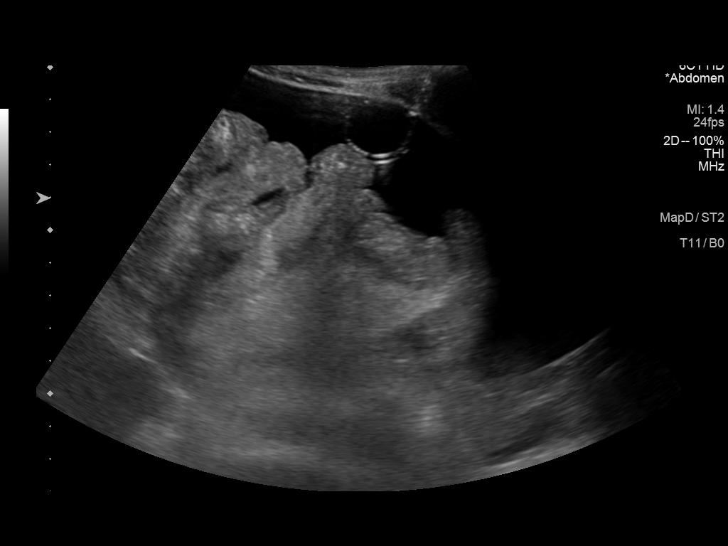
[im 10/13]
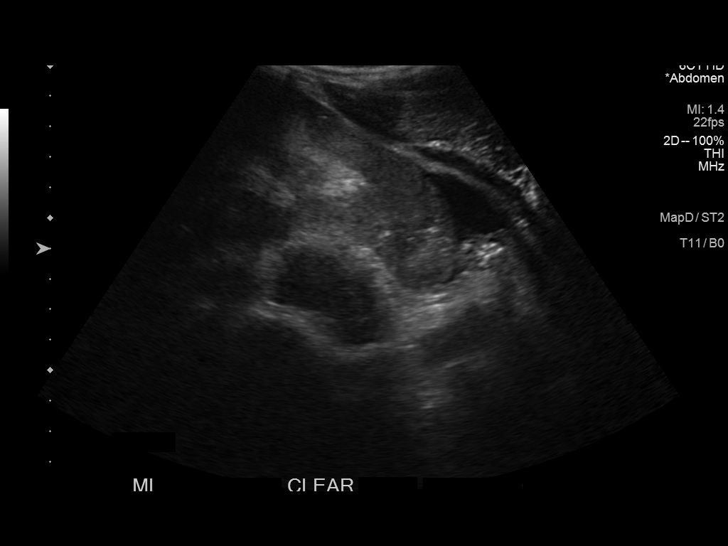
[im 11/13]
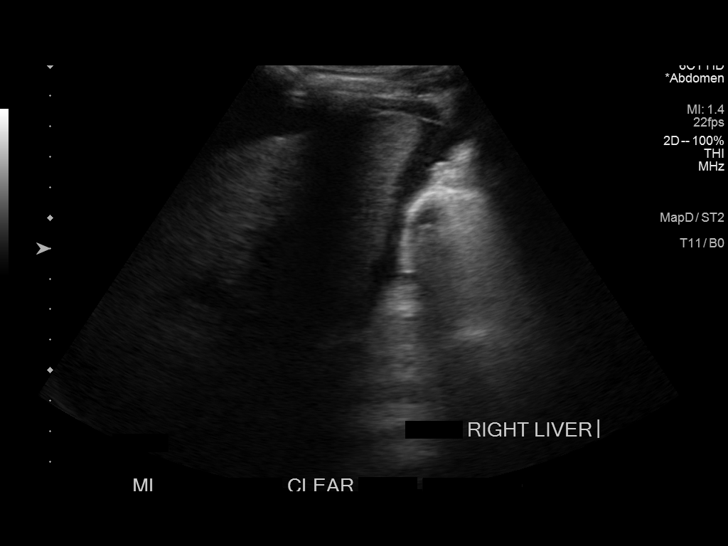
[im 12/13]
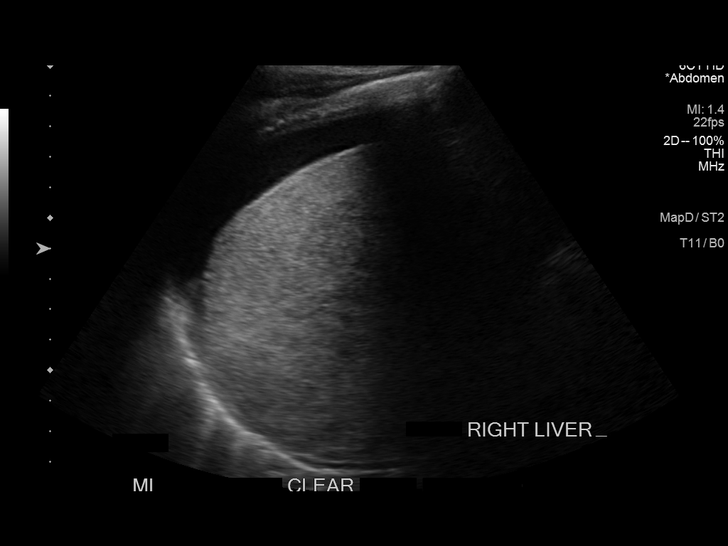
[im 13/13]
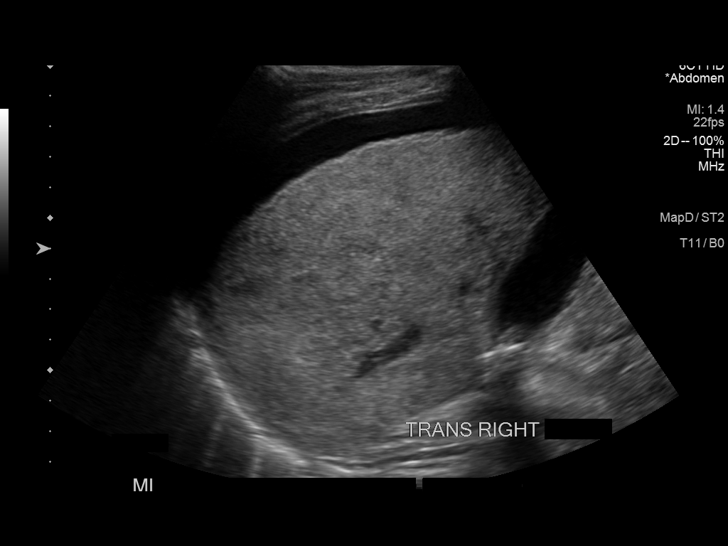

[13 of 13 positions shown; findings below may reference images not displayed]

abdominal
ultrasound - 03/23/2026

MEDICATIONS:
None.

COMPLICATIONS:
None immediate.
Initial ultrasound scanning demonstrates a large amount of ascites
within the right lower abdominal quadrant. The right lower abdomen
was prepped and draped in the usual sterile fashion. 1% lidocaine
with epinephrine was used for local anesthesia. An ultrasound image
was saved for documentation purposed. An 8 Fr Safe-T-Centesis
catheter was introduced. The paracentesis was performed. The
catheter was removed and a dressing was applied. The patient
tolerated the procedure well without immediate post procedural
complication.
FINDINGS: A total of approximately 6 liters of serous fluid was removed.
Samples were sent to the laboratory as requested by the clinical
team.
IMPRESSION: Successful ultrasound-guided paracentesis yielding 6 liters of
peritoneal fluid.

## 2018-01-26 IMAGING — US US PARACENTESIS
1 series · 6 of 6 positions shown · non-contrast
Comparison: none

INDICATION: Ascites.

[Series 1: us paracentesis · 0.26mm/px · 6 of 6 slices shown]
[im 1/6]
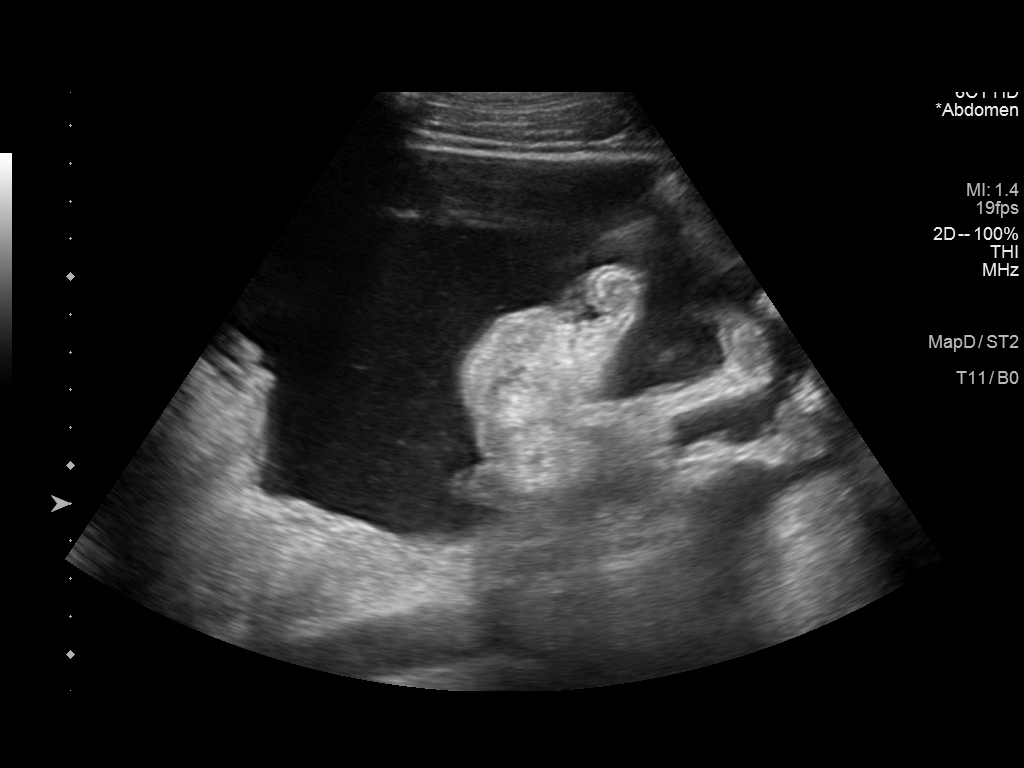
[im 2/6]
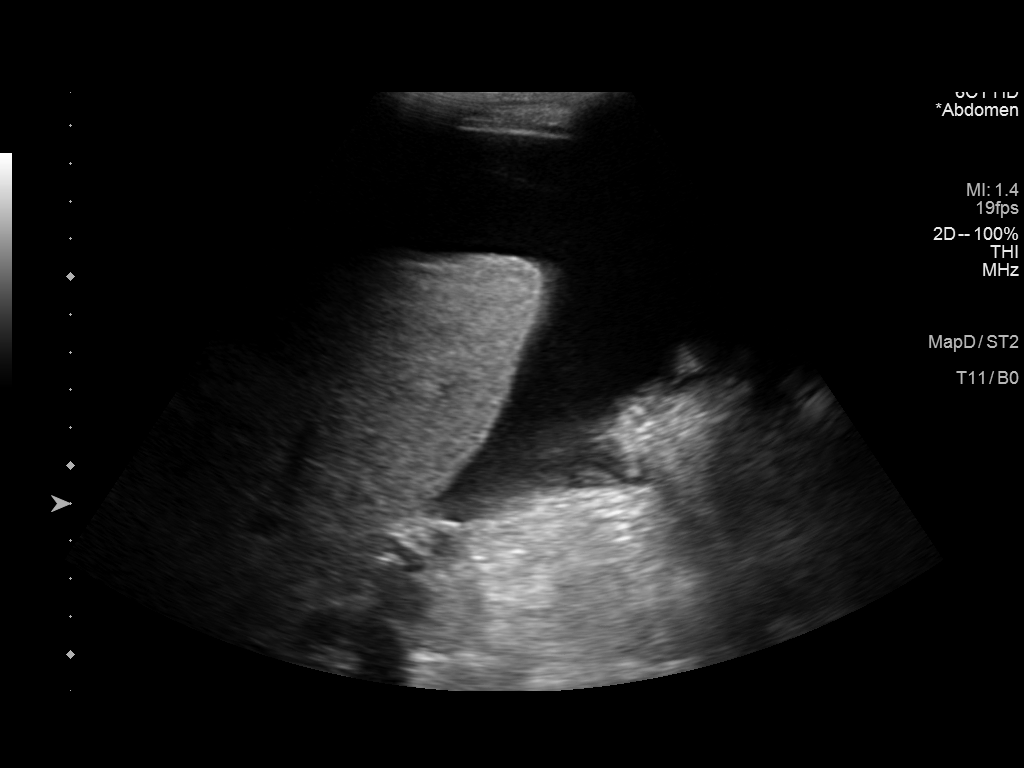
[im 3/6]
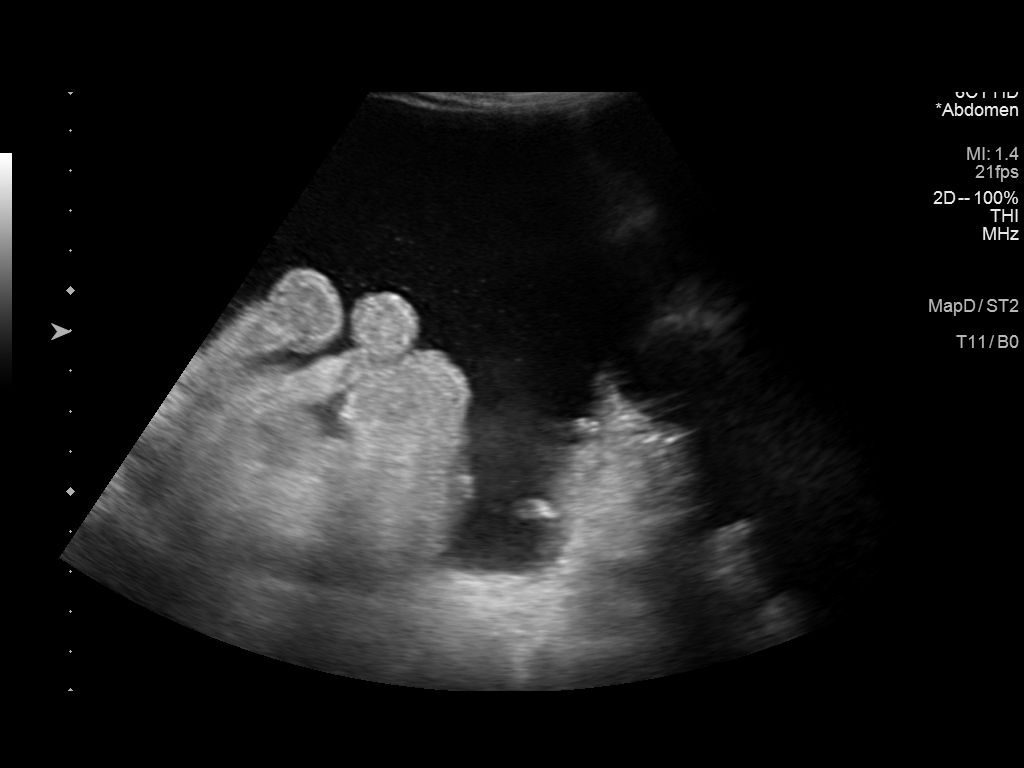
[im 4/6]
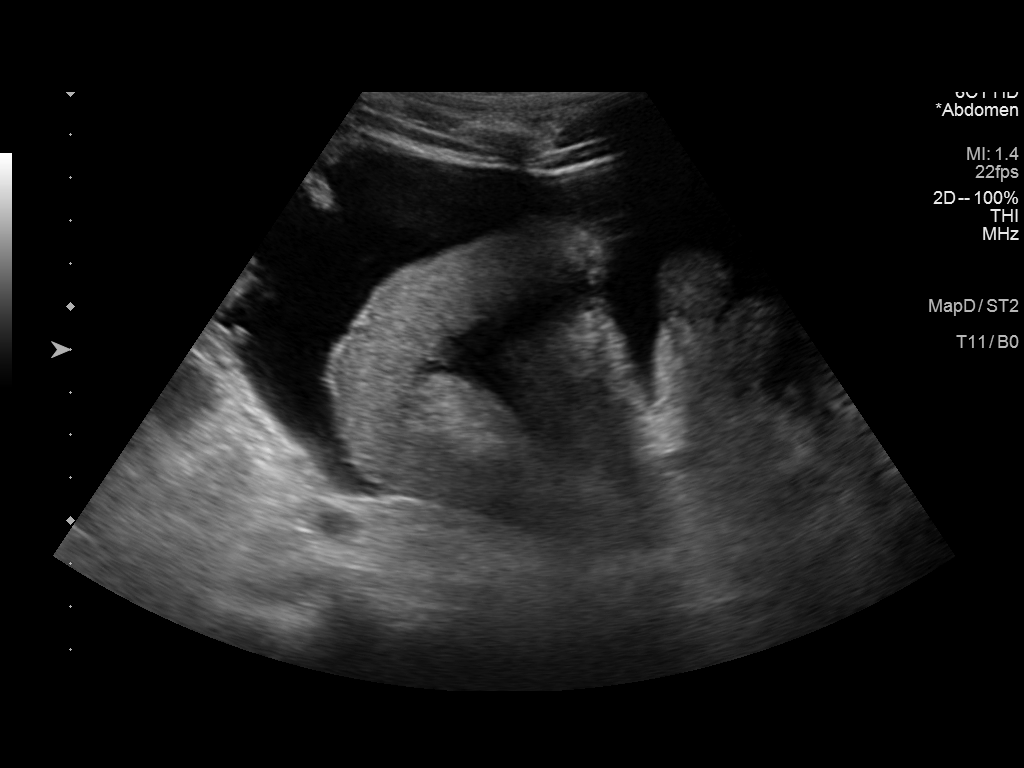
[im 5/6]
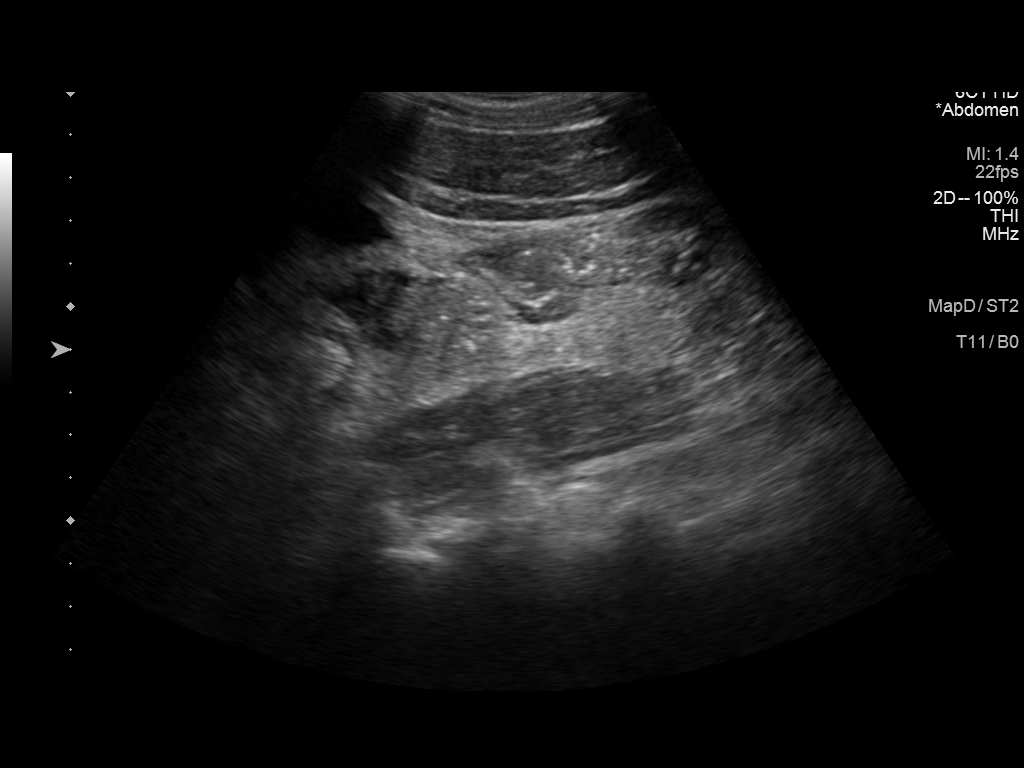
[im 6/6]
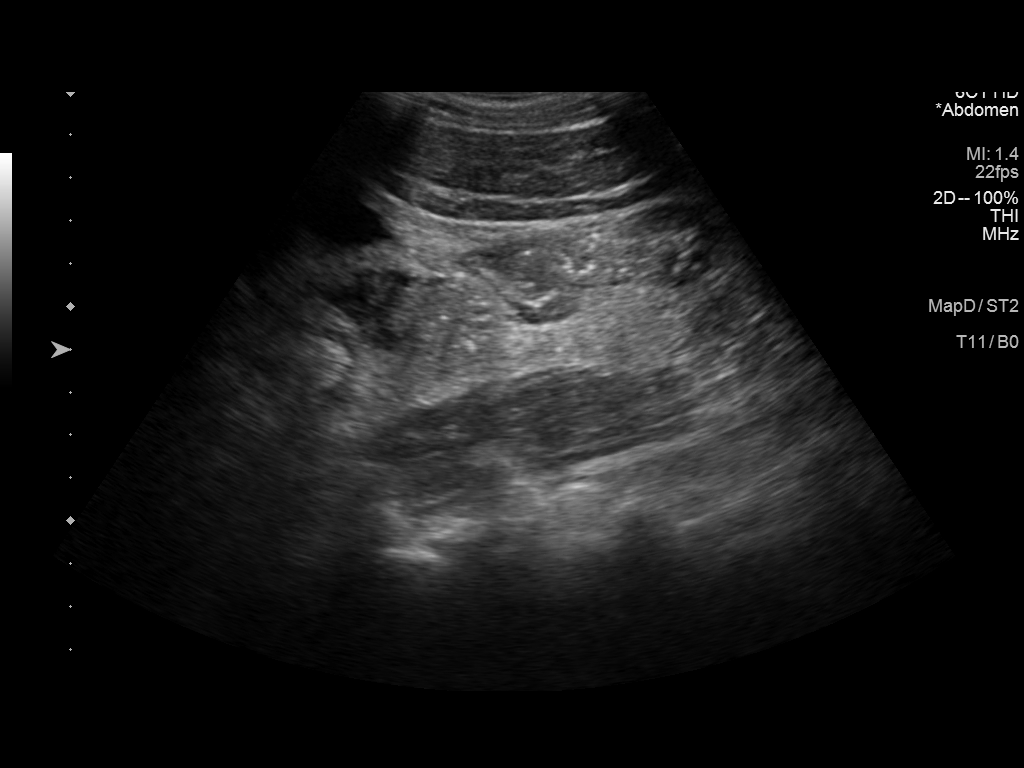

[6 of 6 positions shown; findings below may reference images not displayed]

EXAM:
ULTRASOUND GUIDED therapeutic PARACENTESIS

MEDICATIONS:
None.

COMPLICATIONS:
None immediate.

PROCEDURE:
Informed written consent was obtained from the patient after a
discussion of the risks, benefits and alternatives to treatment. A
timeout was performed prior to the initiation of the procedure.

Initial ultrasound scanning demonstrates a large amount of ascites
within the right lower abdominal quadrant. The right lower abdomen
was prepped and draped in the usual sterile fashion. 1% lidocaine
with epinephrine was used for local anesthesia.

Following this, a Safe-T-Centesis catheter was introduced. An
ultrasound image was saved for documentation purposes. The
paracentesis was performed. The catheter was removed and a dressing
was applied. The patient tolerated the procedure well without
immediate post procedural complication.
FINDINGS: A total of approximately 6.8 L of serous fluid was removed.
IMPRESSION: Successful ultrasound-guided paracentesis yielding 6.8 liters of
peritoneal fluid.

## 2018-01-31 ENCOUNTER — Other Ambulatory Visit: Payer: Self-pay | Admitting: Gastroenterology

## 2018-01-31 DIAGNOSIS — B182 Chronic viral hepatitis C: Secondary | ICD-10-CM

## 2018-02-18 ENCOUNTER — Ambulatory Visit: Payer: Self-pay

## 2018-02-22 ENCOUNTER — Ambulatory Visit: Payer: 59

## 2018-07-06 IMAGING — US US PARACENTESIS
1 series · 11 of 11 positions shown · non-contrast
Comparison: None.

MEDICATIONS:
None.

COMPLICATIONS:
None immediate.

INDICATION: Symptomatic ascites. Please perform ultrasound-guided paracentesis
for diagnostic and therapeutic purposes.

EXAM:
ULTRASOUND-GUIDED PARACENTESIS
TECHNIQUE: Informed written consent was obtained from the patient after a
discussion of the risks, benefits and alternatives to treatment. A
timeout was performed prior to the initiation of the procedure.

[Series 1: us paracentesis · 0.28mm/px · 11 of 11 slices shown]
[im 1/11]
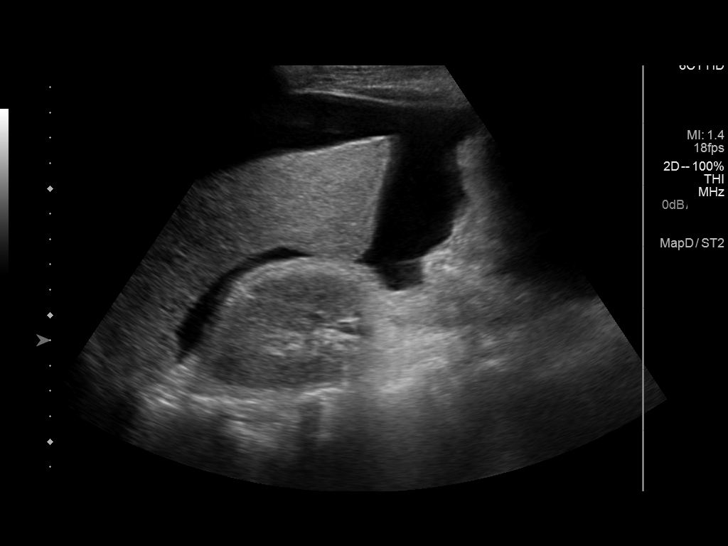
[im 2/11]
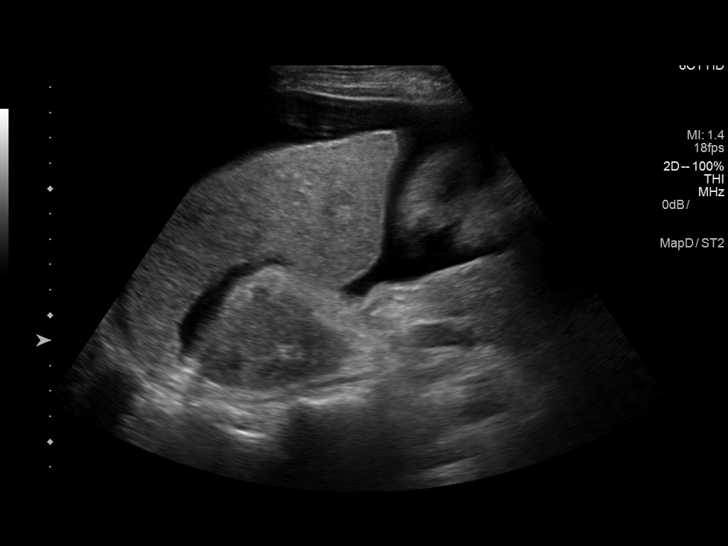
[im 3/11]
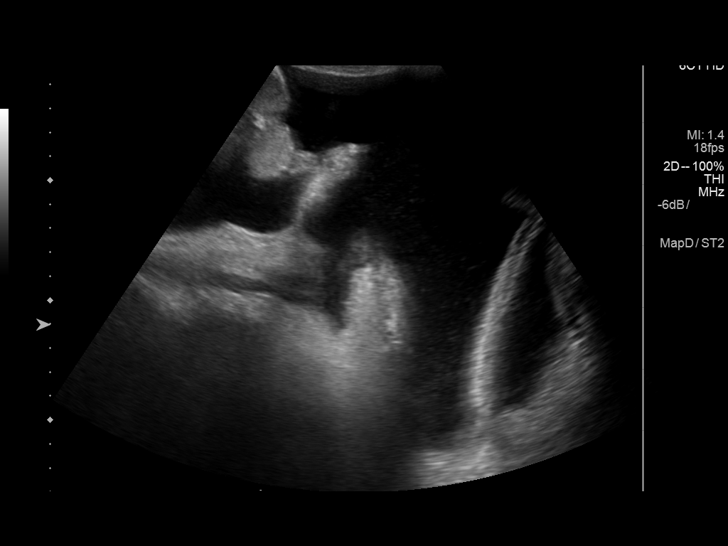
[im 4/11]
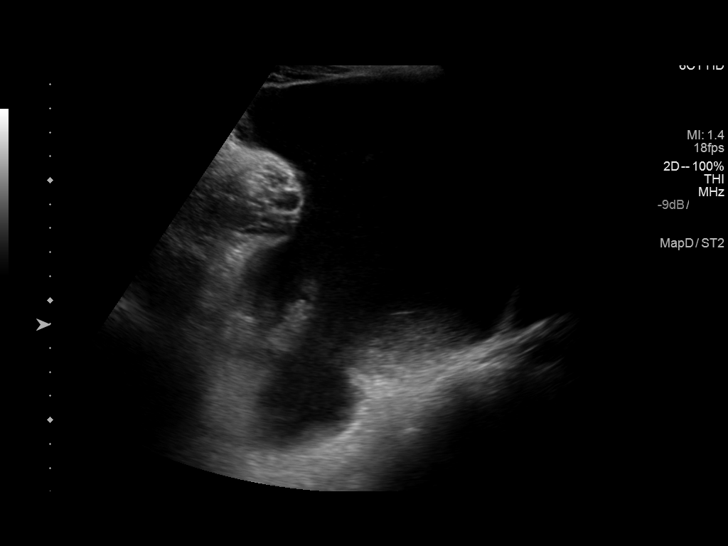
[im 5/11]
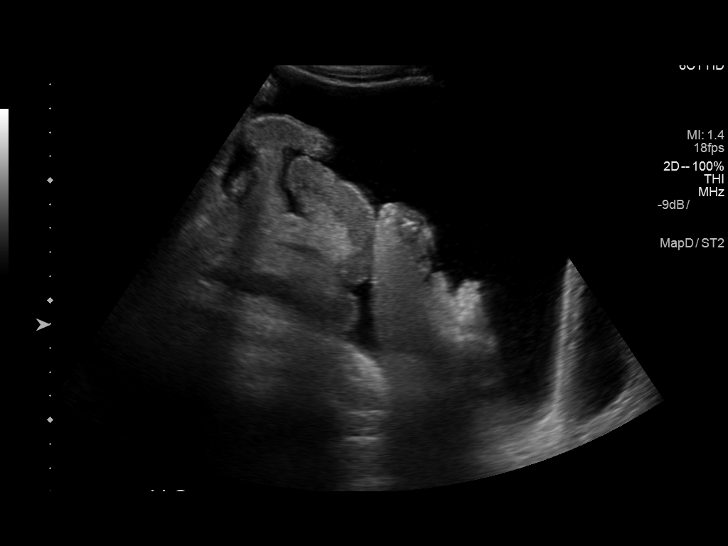
[im 6/11]
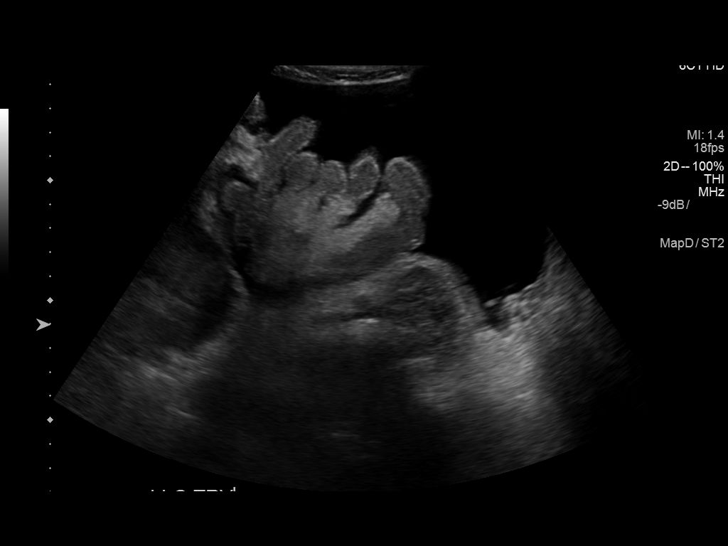
[im 7/11]
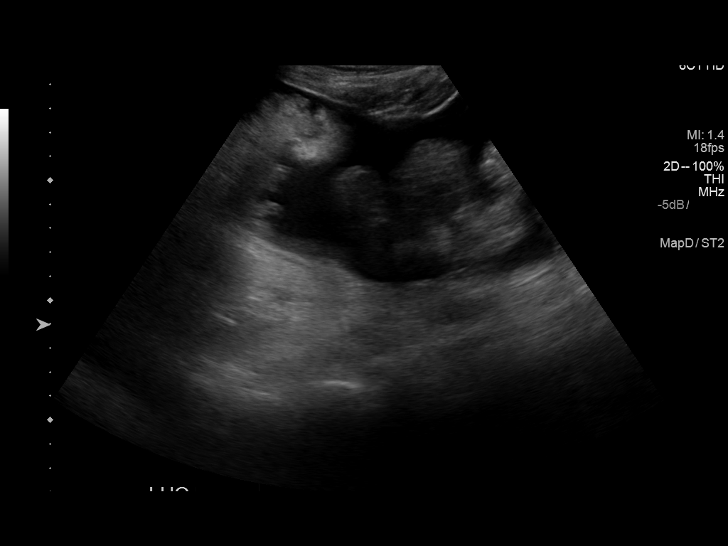
[im 8/11]
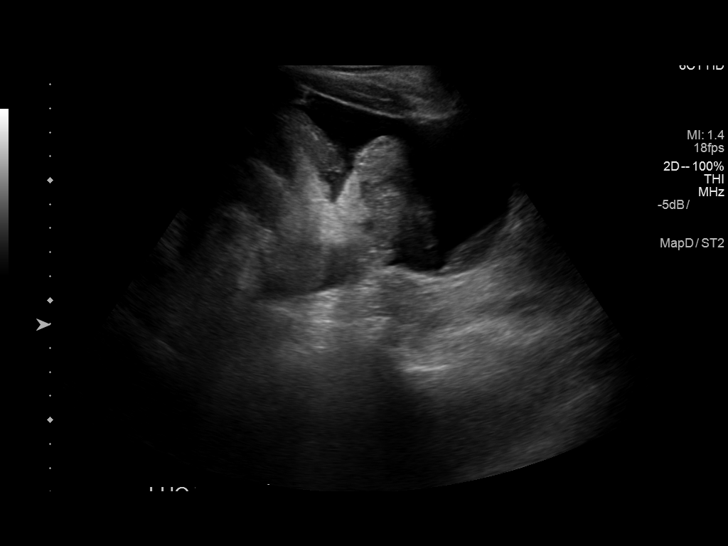
[im 9/11]
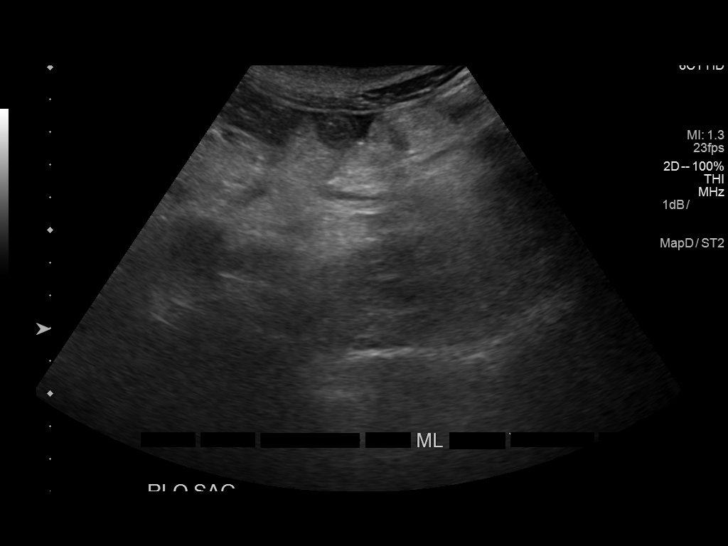
[im 10/11]
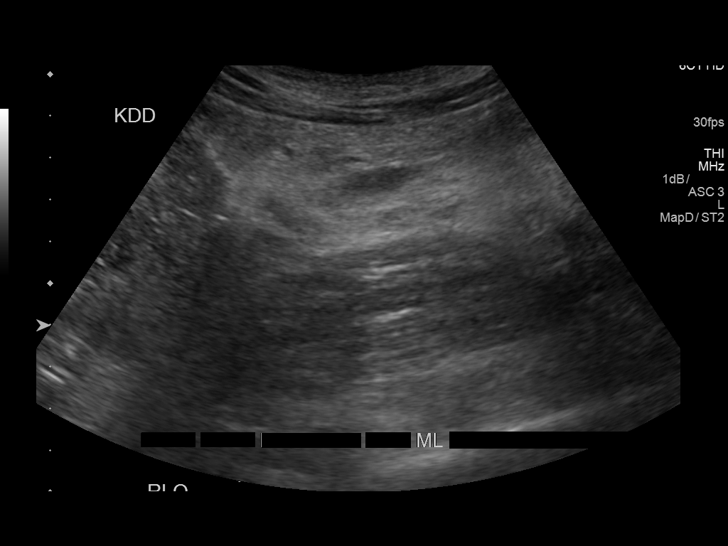
[im 11/11]
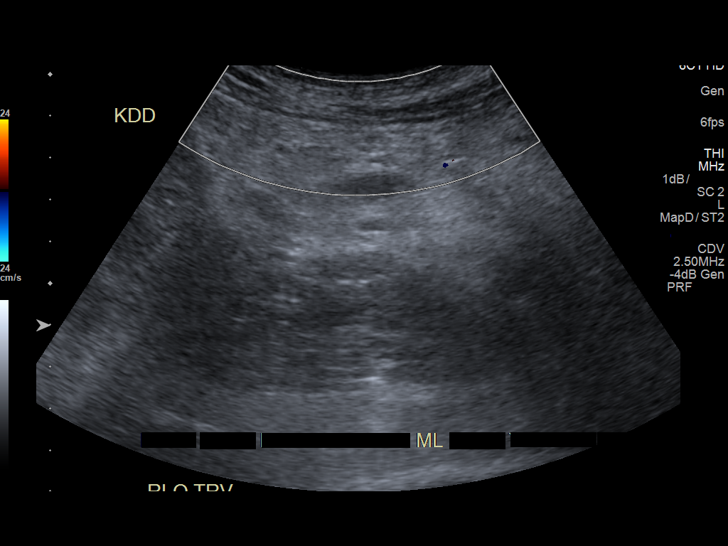

[11 of 11 positions shown; findings below may reference images not displayed]

Initial ultrasound scanning demonstrates a moderate amount of
ascites within the right lower abdominal quadrant. The right lower
abdomen was prepped and draped in the usual sterile fashion. 1%
lidocaine with epinephrine was used for local anesthesia. An
ultrasound image was saved for documentation purposed. An 8 Fr
Safe-T-Centesis catheter was introduced. The paracentesis was
performed. The catheter was removed and a dressing was applied. The
patient tolerated the procedure well without immediate post
procedural complication.
FINDINGS: A total of approximately 3.8 liters of serous fluid was removed.
Samples were sent to the laboratory as requested by the clinical
team.
IMPRESSION: Successful ultrasound-guided paracentesis yielding 3.8 liters of
peritoneal fluid.

## 2018-07-26 IMAGING — US US ABDOMEN COMPLETE
1 series · 13 of 25 positions shown · non-contrast
Comparison: Ultrasound of the abdomen of 07/29/2007

CLINICAL DATA: Generalized abdominal pain, ascites, recent
paracentesis of 03/03/2016

EXAM:
ABDOMEN ULTRASOUND COMPLETE

[Series 1: us abdomen complete · 0.26mm/px · 13 of 110 slices shown]
[im 1/110]
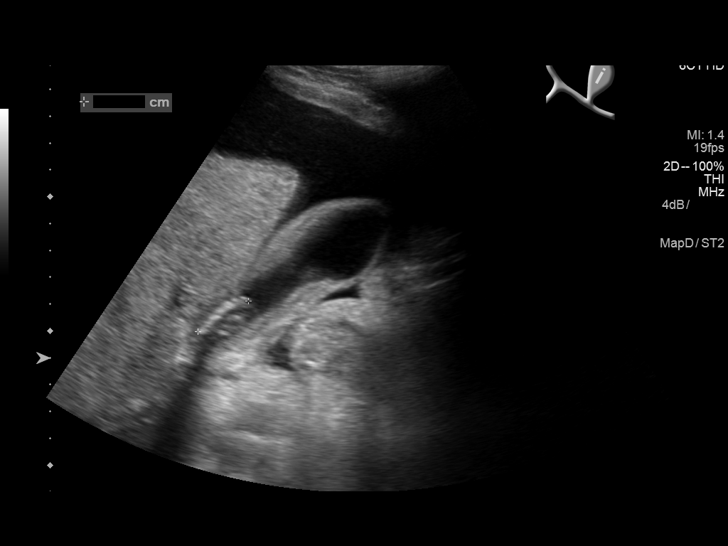
[im 10/110]
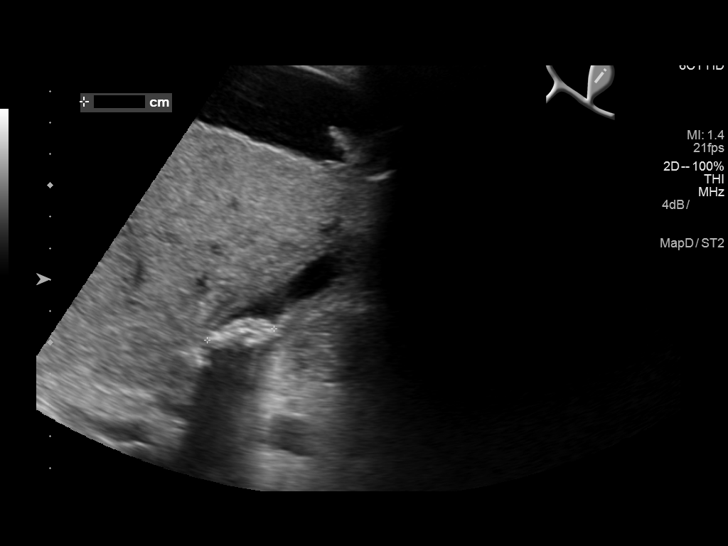
[im 19/110]
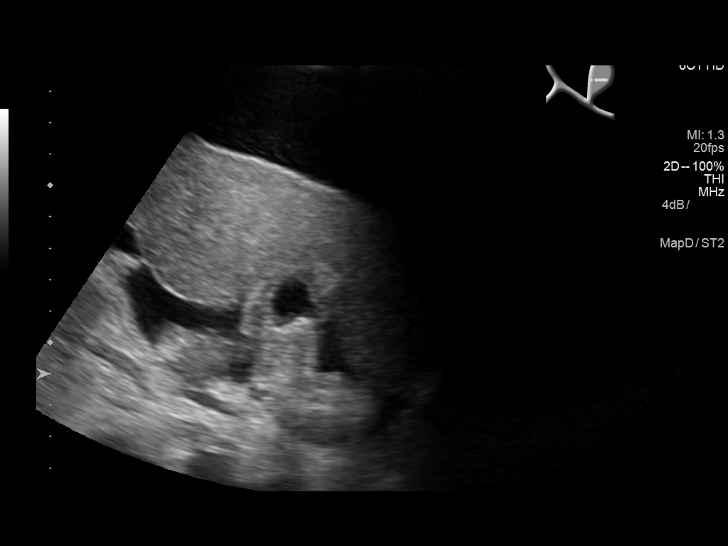
[im 28/110]
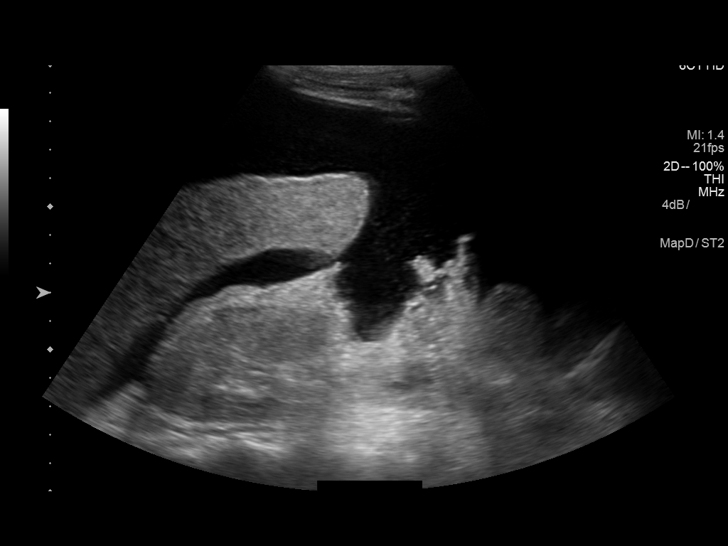
[im 37/110]
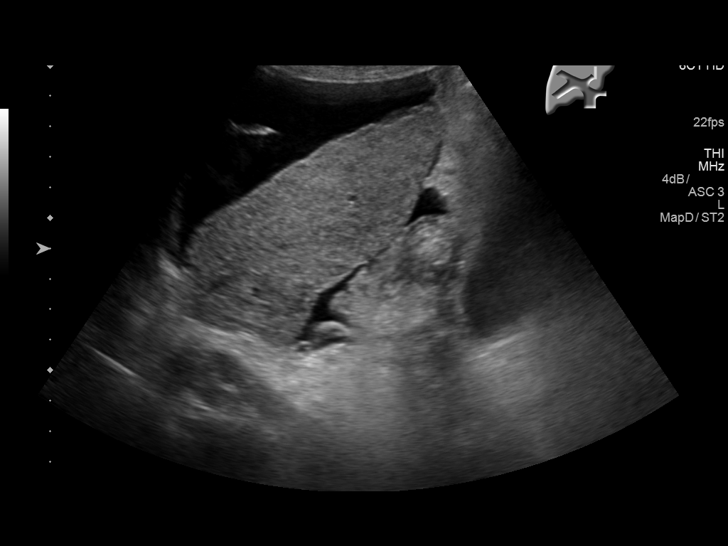
[im 46/110]
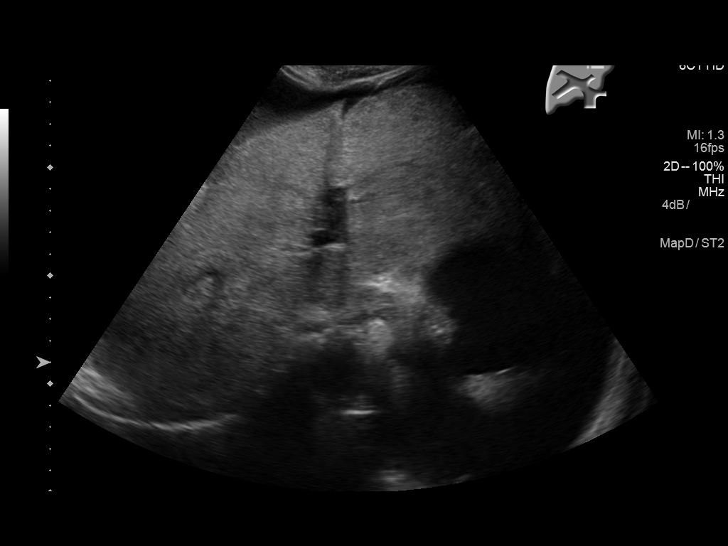
[im 55/110]
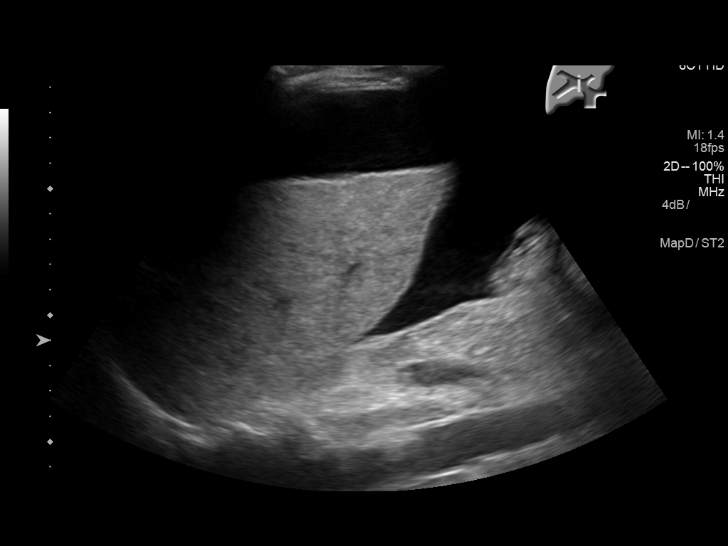
[im 64/110]
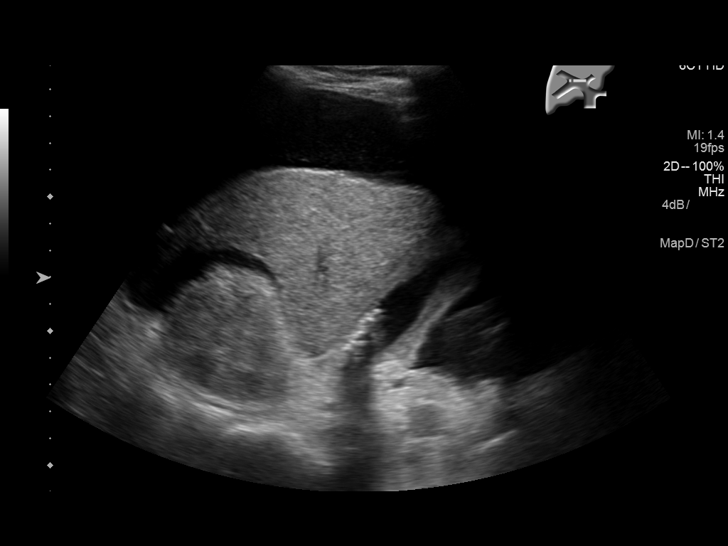
[im 73/110]
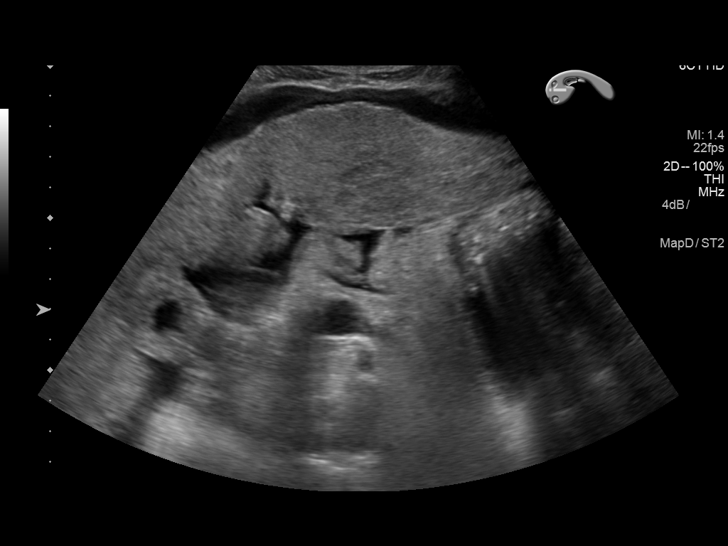
[im 82/110]
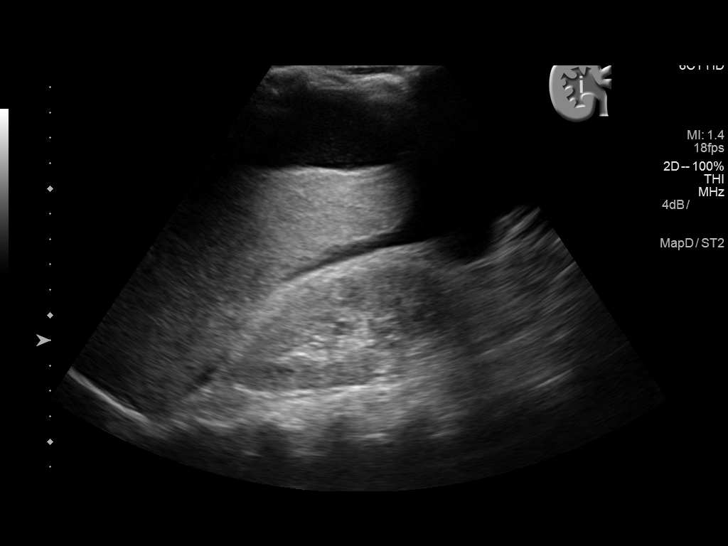
[im 91/110]
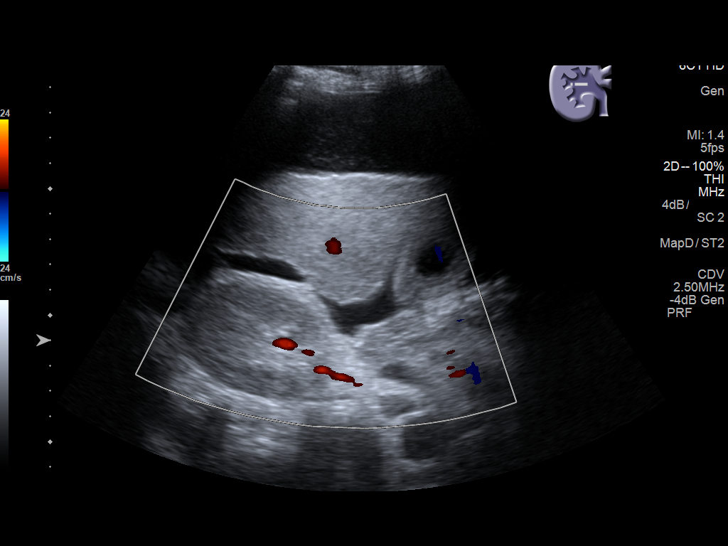
[im 100/110]
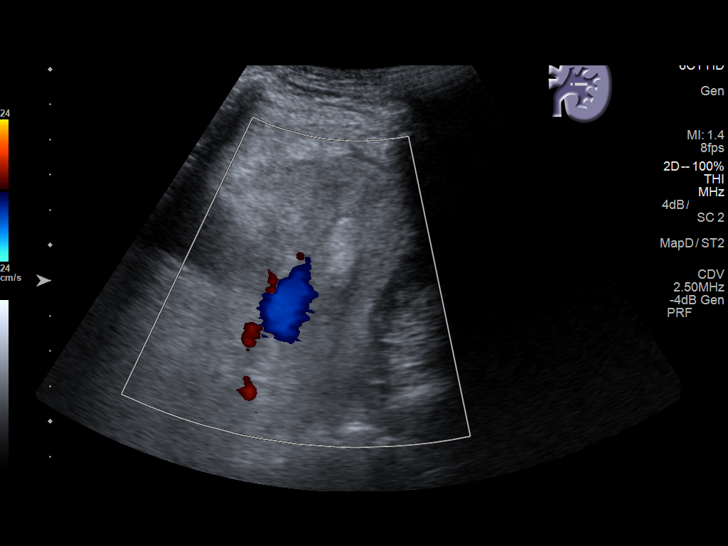
[im 110/110]
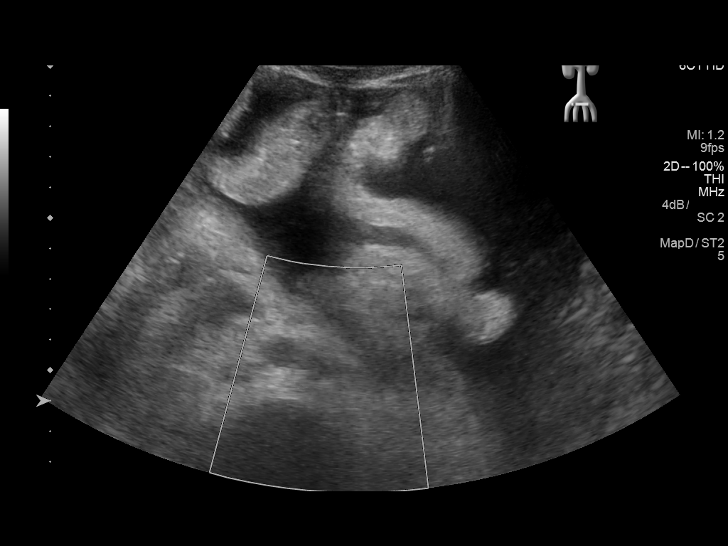

[13 of 25 positions shown; findings below may reference images not displayed]

FINDINGS: Gallbladder: The gallbladder is visualized and multiple mobile and
non mobile gallstones are present within the gallbladder with the
largest measuring 2.2 cm. There is no pain over the gallbladder with
compression. The gallbladder wall is thickened most likely due to
hypoproteinemia. Acute cholecystitis would be difficult to exclude
by ultrasound and clinical correlation is recommended.

Common bile duct: Diameter: Common bile duct measures 7.2 mm in
diameter, tapering distally.

Liver: The liver is echogenic diffusely, and the contours the liver
are nodular, findings consistent with cirrhosis. No focal hepatic
abnormality is seen.

IVC: The IVC is obscured by bowel gas.

Pancreas: The pancreas is largely obscured by bowel gas. Therefore
much of the head and tail of pancreas cannot be evaluated.

Spleen: The spleen measures 4.1 cm.

Right Kidney: Length: 11.0 cm..  No hydronephrosis is seen.

Left Kidney: Length: 9.8 cm..  No hydronephrosis is noted.

Abdominal aorta: The abdominal aorta is normal in caliber.

Other findings: There is a moderately large amount of abdominal
ascites present.
IMPRESSION: 1. Moderately large amount of ascites throughout the peritoneal
cavity.
2. Changes of cirrhosis.  No focal hepatic abnormality.
3. Multiple gallstones with thickened gallbladder wall most likely
due to hypoproteinemia. Correlate clinically.
4. Much of the pancreas is obscured by bowel gas.

## 2018-10-19 IMAGING — US US PARACENTESIS
1 series · 9 of 9 positions shown · non-contrast
Comparison: none

INDICATION: Hepatitis-C, cirrhosis and recurrent ascites.

[Series 1: us paracentesis · 0.26mm/px · 9 of 9 slices shown]
[im 1/9]
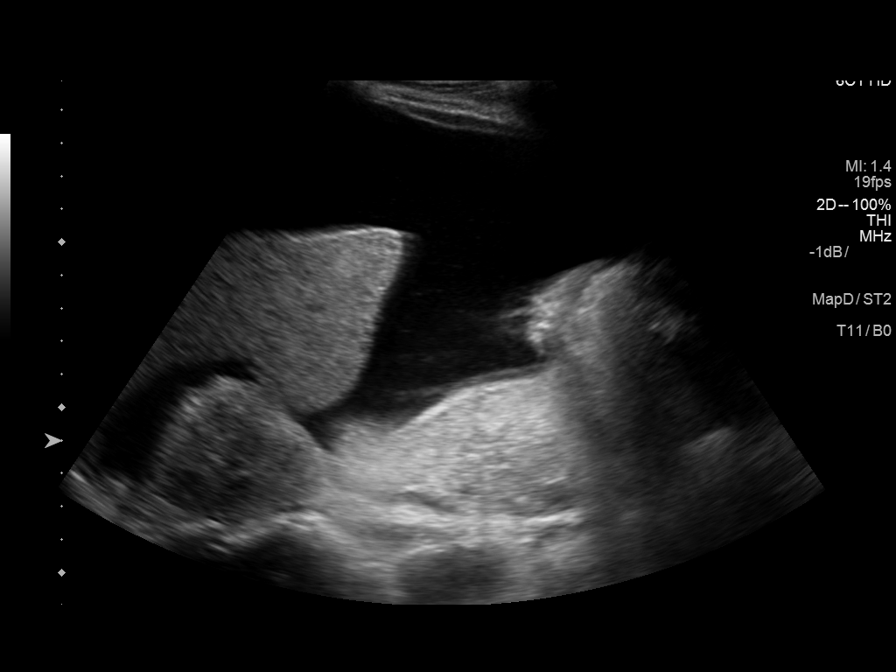
[im 2/9]
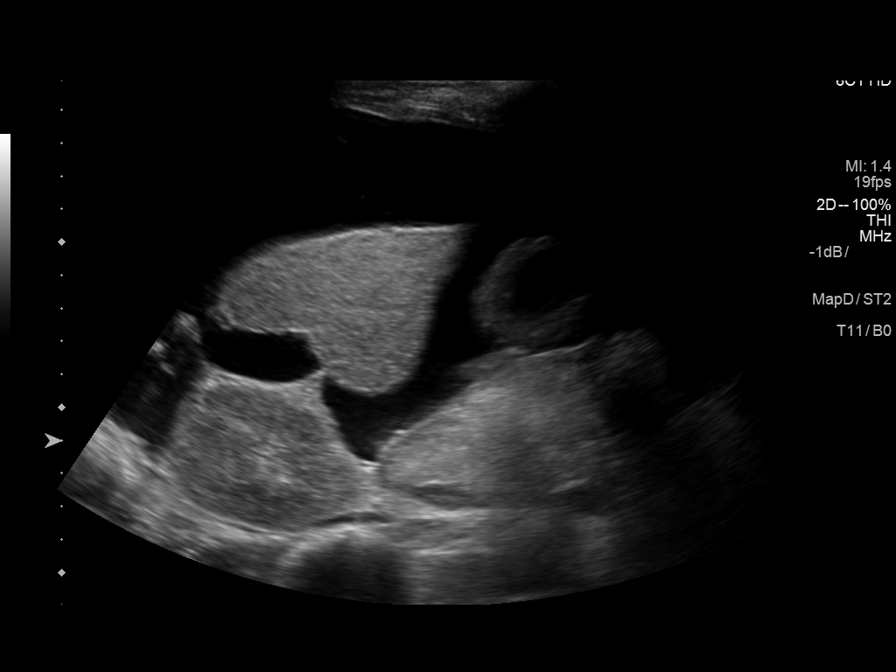
[im 3/9]
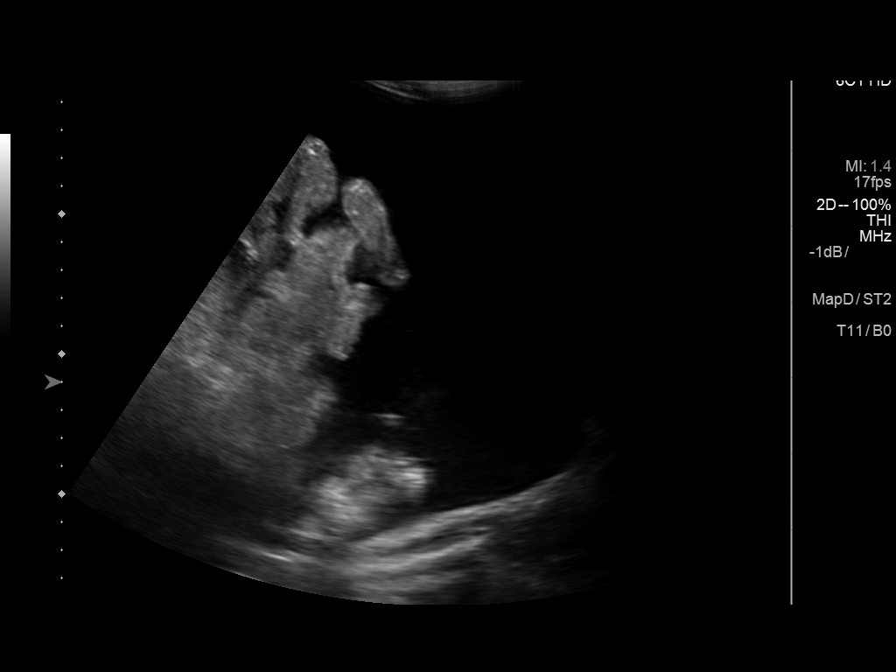
[im 4/9]
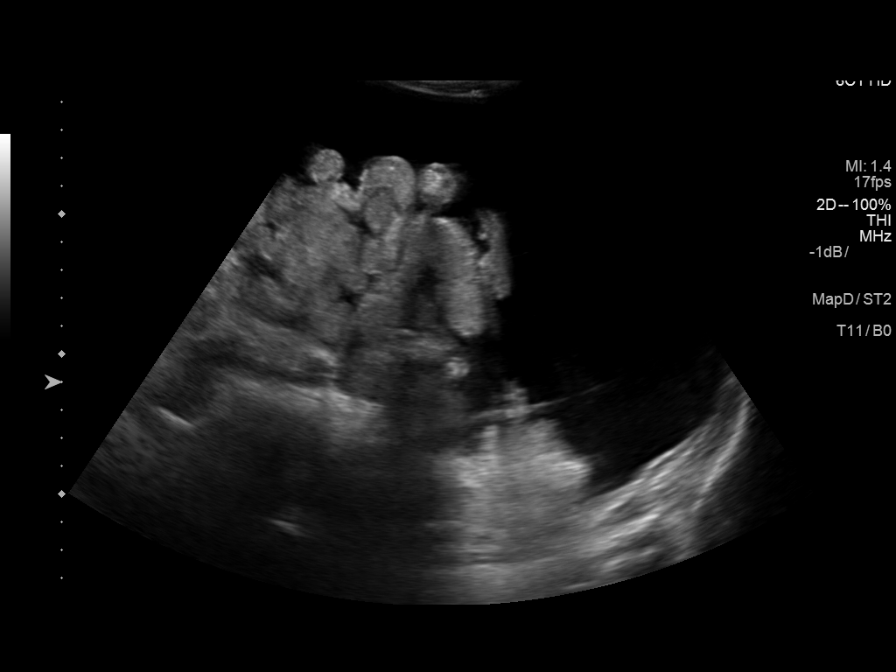
[im 5/9]
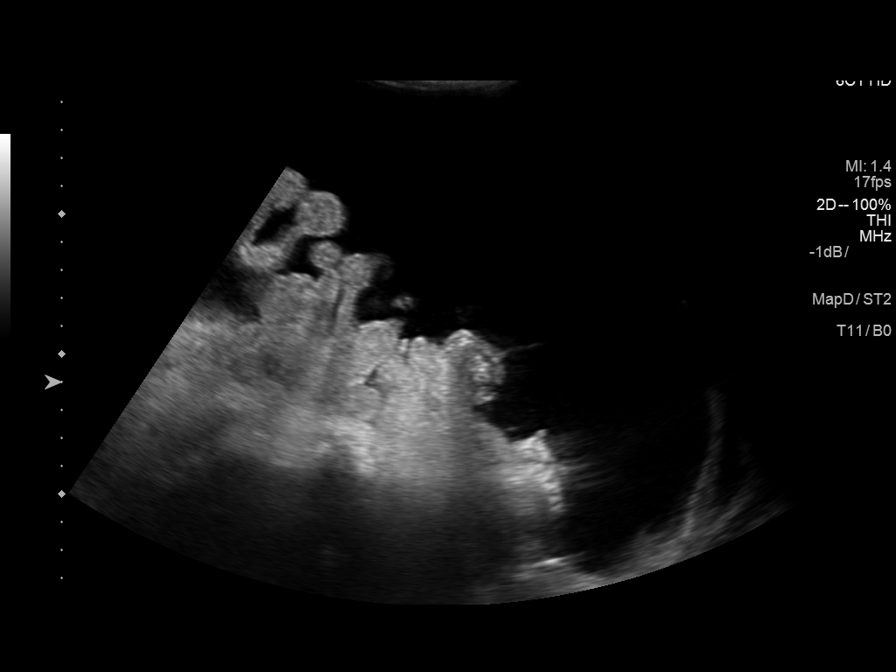
[im 6/9]
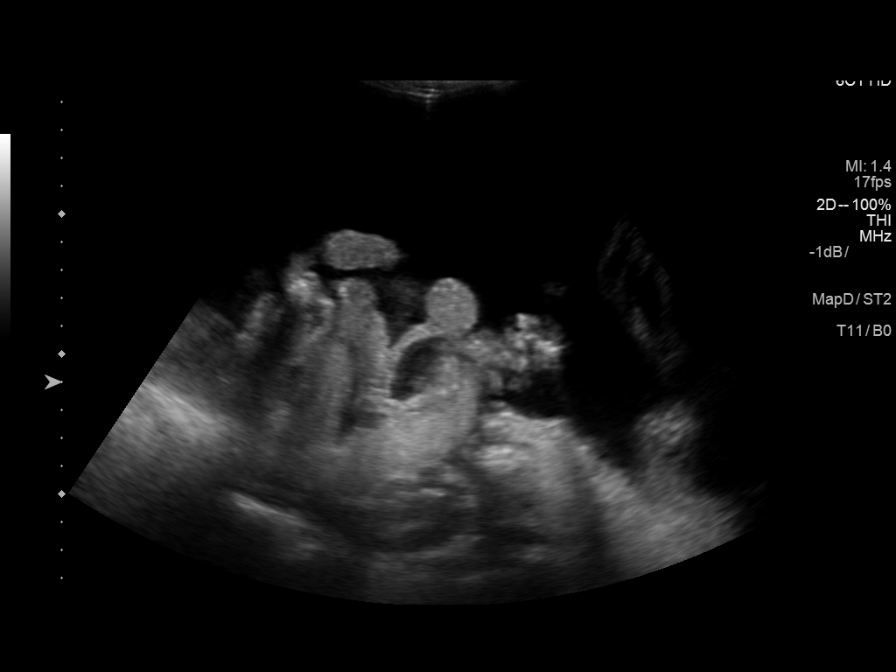
[im 7/9]
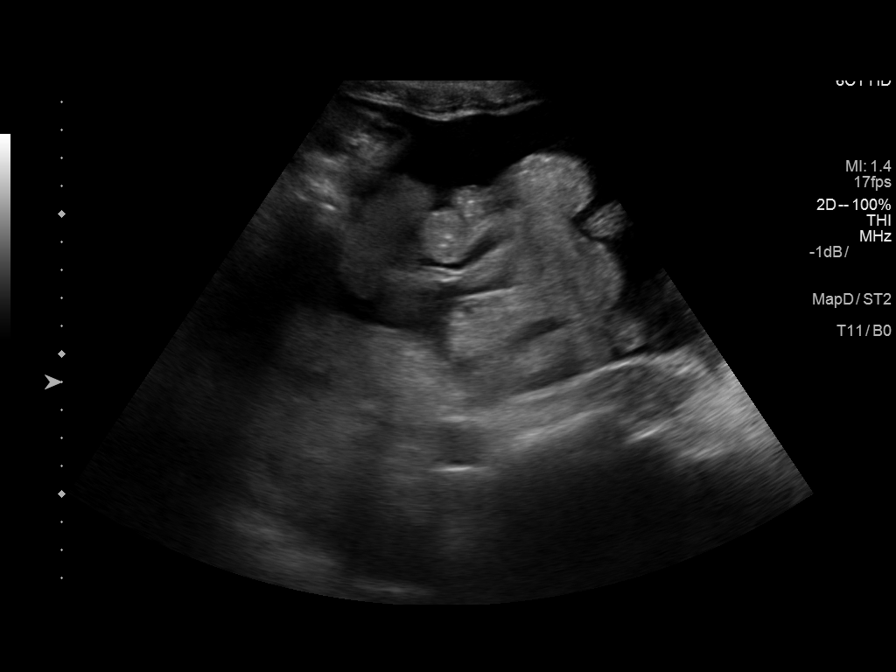
[im 8/9]
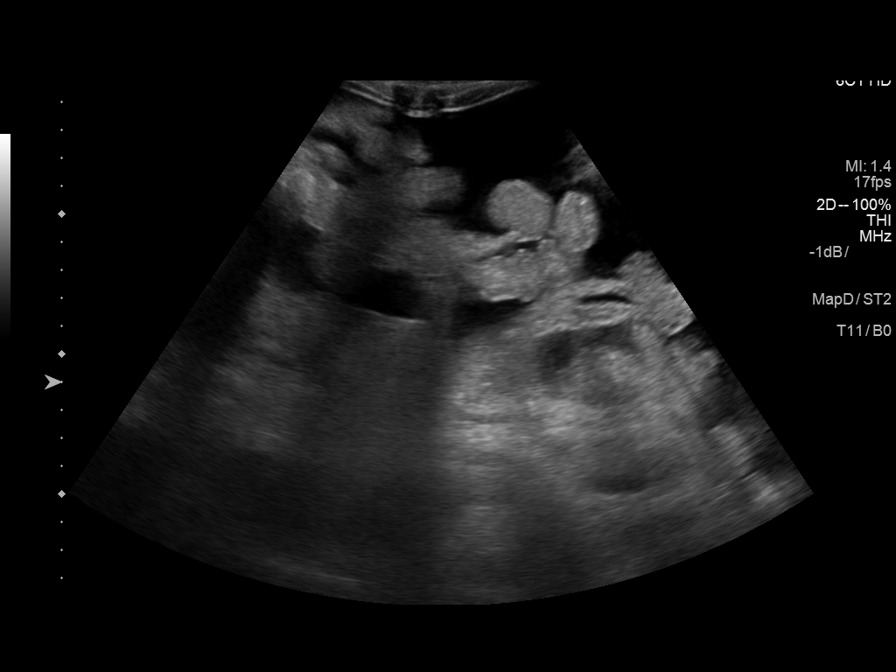
[im 9/9]
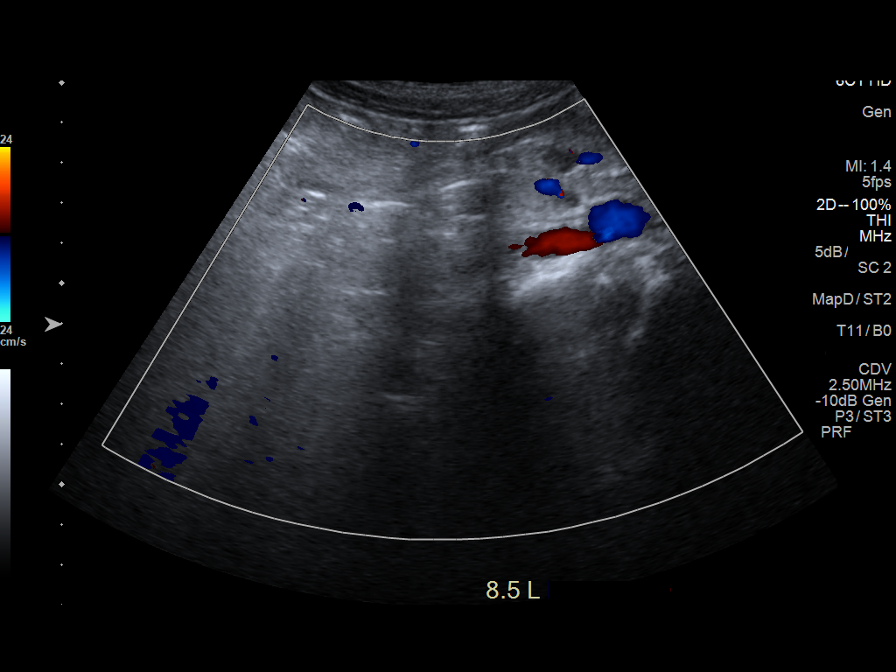

[9 of 9 positions shown; findings below may reference images not displayed]

EXAM:
ULTRASOUND GUIDED PARACENTESIS

MEDICATIONS:
None.

COMPLICATIONS:
None immediate.

PROCEDURE:
Informed written consent was obtained from the patient after a
discussion of the risks, benefits and alternatives to treatment. A
timeout was performed prior to the initiation of the procedure.

Initial ultrasound scanning demonstrates a large amount of ascites
within the right lower abdominal quadrant. The right lower abdomen
was prepped and draped in the usual sterile fashion. 1% lidocaine
was used for local anesthesia.

Following this, a 6 Fr Safe-T-Centesis catheter was introduced. An
ultrasound image was saved for documentation purposes. The
paracentesis was performed. The catheter was removed and a dressing
was applied. The patient tolerated the procedure well without
immediate post procedural complication.
FINDINGS: A total of approximately 8.5 L of yellow fluid was removed.
IMPRESSION: Successful ultrasound-guided paracentesis yielding 8.5 liters of
peritoneal fluid.

## 2019-02-19 ENCOUNTER — Telehealth: Payer: Self-pay | Admitting: Pharmacy Technician

## 2019-02-19 NOTE — Telephone Encounter (Signed)
Patient has private insurance.Only patient without insurance are eligibile for [facility] program. No additional medication assistance will be provided until patient provides documentation that insurance has been cancelled. Patient notified by letter. [facility]

## 2019-02-21 ENCOUNTER — Emergency Department: Payer: BC Managed Care – PPO

## 2019-02-21 ENCOUNTER — Encounter: Payer: Self-pay | Admitting: Emergency Medicine

## 2019-02-21 ENCOUNTER — Other Ambulatory Visit: Payer: Self-pay

## 2019-02-21 ENCOUNTER — Observation Stay
Admission: EM | Admit: 2019-02-21 | Discharge: 2019-02-22 | Disposition: A | Discharge status: Home / Self Care | Payer: BC Managed Care – PPO | Attending: Internal Medicine | Admitting: Internal Medicine

## 2019-02-21 DIAGNOSIS — I639 Cerebral infarction, unspecified: Secondary | ICD-10-CM | POA: Diagnosis not present

## 2019-02-21 DIAGNOSIS — F1020 Alcohol dependence, uncomplicated: Secondary | ICD-10-CM | POA: Diagnosis not present

## 2019-02-21 DIAGNOSIS — Z1159 Encounter for screening for other viral diseases: Secondary | ICD-10-CM | POA: Diagnosis not present

## 2019-02-21 DIAGNOSIS — R262 Difficulty in walking, not elsewhere classified: Secondary | ICD-10-CM | POA: Diagnosis not present

## 2019-02-21 DIAGNOSIS — Z79899 Other long term (current) drug therapy: Secondary | ICD-10-CM | POA: Insufficient documentation

## 2019-02-21 DIAGNOSIS — H532 Diplopia: Secondary | ICD-10-CM

## 2019-02-21 DIAGNOSIS — B182 Chronic viral hepatitis C: Secondary | ICD-10-CM | POA: Insufficient documentation

## 2019-02-21 LAB — COMPREHENSIVE METABOLIC PANEL
ALT: 44 U/L (ref 0–44)
AST: 87 U/L — ABNORMAL HIGH (ref 15–41)
Albumin: 3.8 g/dL (ref 3.5–5.0)
Alkaline Phosphatase: 104 U/L (ref 38–126)
Anion gap: 9 (ref 5–15)
BUN: 17 mg/dL (ref 6–20)
CO2: 24 mmol/L (ref 22–32)
Calcium: 9.5 mg/dL (ref 8.9–10.3)
Chloride: 104 mmol/L (ref 98–111)
Creatinine, Ser: 1.04 mg/dL (ref 0.61–1.24)
GFR calc Af Amer: >60 mL/min (ref >60)
GFR calc non Af Amer: >60 mL/min (ref >60)
Glucose, Bld: 92 mg/dL (ref 70–99)
Potassium: 4.1 mmol/L (ref 3.5–5.1)
Sodium: 137 mmol/L (ref 135–145)
Total Bilirubin: 1.2 mg/dL (ref 0.3–1.2)
Total Protein: 8 g/dL (ref 6.5–8.1)

## 2019-02-21 LAB — URINALYSIS, COMPLETE (UACMP) WITH MICROSCOPIC
Bacteria, UA: NONE SEEN
Bilirubin Urine: NEGATIVE
Glucose, UA: NEGATIVE mg/dL
Hgb urine dipstick: NEGATIVE
Ketones, ur: NEGATIVE mg/dL
Leukocytes,Ua: NEGATIVE
Nitrite: NEGATIVE
Protein, ur: 30 mg/dL — AB
Specific Gravity, Urine: 1.017 (ref 1.005–1.030)
Squamous Epithelial / HPF: NONE SEEN (ref 0–5)
pH: 6 (ref 5.0–8.0)

## 2019-02-21 LAB — SEDIMENTATION RATE: Sed Rate: 18 mm/hr (ref 0–20)

## 2019-02-21 LAB — CBC
HCT: 36.1 % — ABNORMAL LOW (ref 39.0–52.0)
Hemoglobin: 11.7 g/dL — ABNORMAL LOW (ref 13.0–17.0)
MCH: 30.2 pg (ref 26.0–34.0)
MCHC: 32.4 g/dL (ref 30.0–36.0)
MCV: 93 fL (ref 80.0–100.0)
Platelets: 104 10*3/uL — ABNORMAL LOW (ref 150–400)
RBC: 3.88 MIL/uL — ABNORMAL LOW (ref 4.22–5.81)
RDW: 12.5 % (ref 11.5–15.5)
WBC: 4.2 10*3/uL (ref 4.0–10.5)
nRBC: 0 % (ref 0.0–0.2)

## 2019-02-21 LAB — DIFFERENTIAL
Abs Immature Granulocytes: 0.01 10*3/uL (ref 0.00–0.07)
Basophils Absolute: 0 10*3/uL (ref 0.0–0.1)
Basophils Relative: 1 %
Eosinophils Absolute: 0.2 10*3/uL (ref 0.0–0.5)
Eosinophils Relative: 4 %
Immature Granulocytes: 0 %
Lymphocytes Relative: 27 %
Lymphs Abs: 1.1 10*3/uL (ref 0.7–4.0)
Monocytes Absolute: 0.7 10*3/uL (ref 0.1–1.0)
Monocytes Relative: 17 %
Neutro Abs: 2.2 10*3/uL (ref 1.7–7.7)
Neutrophils Relative %: 51 %

## 2019-02-21 LAB — PROTIME-INR
INR: 1 (ref 0.8–1.2)
Prothrombin Time: 13.1 seconds (ref 11.4–15.2)

## 2019-02-21 LAB — URINE DRUG SCREEN, QUALITATIVE (ARMC ONLY)
Amphetamines, Ur Screen: NOT DETECTED
Barbiturates, Ur Screen: NOT DETECTED
Benzodiazepine, Ur Scrn: NOT DETECTED
Cannabinoid 50 Ng, Ur ~~LOC~~: NOT DETECTED
Cocaine Metabolite,Ur ~~LOC~~: NOT DETECTED
MDMA (Ecstasy)Ur Screen: NOT DETECTED
Methadone Scn, Ur: NOT DETECTED
Opiate, Ur Screen: NOT DETECTED
Phencyclidine (PCP) Ur S: NOT DETECTED
Tricyclic, Ur Screen: NOT DETECTED

## 2019-02-21 LAB — LIPID PANEL
Cholesterol: 198 mg/dL (ref 0–200)
HDL: 100 mg/dL (ref >40)
LDL Cholesterol: 83 mg/dL (ref 0–99)
Total CHOL/HDL Ratio: 2 RATIO
Triglycerides: 75 mg/dL (ref <150)
VLDL: 15 mg/dL (ref 0–40)

## 2019-02-21 LAB — APTT: aPTT: 31 seconds (ref 24–36)

## 2019-02-21 LAB — TSH: TSH: 2.534 u[IU]/mL (ref 0.350–4.500)

## 2019-02-21 MED ORDER — SPIRONOLACTONE 25 MG PO TABS
25.0000 mg | ORAL_TABLET | Freq: Every day | ORAL | Status: DC
  Administered 2019-02-21 – 2019-02-22 (×2): 25 mg via ORAL
  Filled 2019-02-21 (×2): qty 1

## 2019-02-21 MED ORDER — ONDANSETRON HCL 4 MG/2ML IJ SOLN: 4.0000 mg | Freq: Four times a day (QID) | INTRAMUSCULAR | Status: DC | PRN

## 2019-02-21 MED ORDER — DOCUSATE SODIUM 100 MG PO CAPS
100.0000 mg | ORAL_CAPSULE | Freq: Two times a day (BID) | ORAL | Status: DC
  Administered 2019-02-21: 21:00:00 100 mg via ORAL
  Filled 2019-02-21 (×2): qty 1

## 2019-02-21 MED ORDER — ATORVASTATIN CALCIUM 20 MG PO TABS
40.0000 mg | ORAL_TABLET | Freq: Every day | ORAL | Status: DC
  Administered 2019-02-21: 40 mg via ORAL
  Filled 2019-02-21: qty 2

## 2019-02-21 MED ORDER — PANTOPRAZOLE SODIUM 40 MG PO TBEC
40.0000 mg | DELAYED_RELEASE_TABLET | Freq: Every day | ORAL | Status: DC
  Administered 2019-02-21 – 2019-02-22 (×2): 40 mg via ORAL
  Filled 2019-02-21 (×2): qty 1

## 2019-02-21 MED ORDER — HYDRALAZINE HCL 20 MG/ML IJ SOLN: 10.0000 mg | Freq: Four times a day (QID) | INTRAMUSCULAR | Status: DC | PRN

## 2019-02-21 MED ORDER — FOLIC ACID 1 MG PO TABS
1000.0000 ug | ORAL_TABLET | Freq: Every day | ORAL | Status: DC
  Administered 2019-02-21 – 2019-02-22 (×2): 1 mg via ORAL
  Filled 2019-02-21 (×2): qty 1

## 2019-02-21 MED ORDER — ONDANSETRON HCL 4 MG PO TABS: 4.0000 mg | ORAL_TABLET | Freq: Four times a day (QID) | ORAL | Status: DC | PRN

## 2019-02-21 MED ORDER — ENOXAPARIN SODIUM 40 MG/0.4ML ~~LOC~~ SOLN
40.0000 mg | SUBCUTANEOUS | Status: DC
  Administered 2019-02-21: 17:00:00 40 mg via SUBCUTANEOUS
  Filled 2019-02-21 (×2): qty 0.4

## 2019-02-21 MED ORDER — ASPIRIN 81 MG PO CHEW
81.0000 mg | CHEWABLE_TABLET | Freq: Every day | ORAL | Status: DC
  Administered 2019-02-21 – 2019-02-22 (×2): 81 mg via ORAL
  Filled 2019-02-21 (×2): qty 1

## 2019-02-21 MED ORDER — SODIUM CHLORIDE 0.9% FLUSH
3.0000 mL | Freq: Once | INTRAVENOUS | Status: AC
  Administered 2019-02-21: 3 mL via INTRAVENOUS

## 2019-02-21 MED ORDER — VITAMIN B-1 100 MG PO TABS
100.0000 mg | ORAL_TABLET | Freq: Every day | ORAL | Status: DC
  Administered 2019-02-21 – 2019-02-22 (×2): 100 mg via ORAL
  Filled 2019-02-21 (×2): qty 1

## 2019-02-21 MED ORDER — ACETAMINOPHEN 325 MG PO TABS: 650.0000 mg | ORAL_TABLET | Freq: Four times a day (QID) | ORAL | Status: DC | PRN

## 2019-02-21 MED ORDER — ACETAMINOPHEN 650 MG RE SUPP: 650.0000 mg | Freq: Four times a day (QID) | RECTAL | Status: DC | PRN

## 2019-02-21 NOTE — ED Notes (Signed)
ED TO INPATIENT HANDOFF REPORT  ED Nurse Name and Phone #: Annie Main [phone]  S Name/Age/Gender [name] 58 y.o. male Room/Bed: ED18A/ED18A  Code Status   Code Status: Not on file  Home/SNF/Other Home Patient oriented to: self, place, time and situation Is this baseline? Yes   Triage Complete: Triage complete  Chief Complaint changes in vision  Triage Note Pt to ED via POV c/o dizziness that started yesterday around 0930. Pt states that he was mowing the yard and he became dizzy. Pt states that he also started having some double vision and problems with his balance around 1000. Pt denies any unilateral weakness or numbness. No facial droop or slurred speech noted at this time. Pt denies nausea or vomiting. Pt states that the dizziness comes and goes, is worse when he stands. Pt is in NAD at this time.    Allergies No Known Allergies  Level of Care/Admitting Diagnosis ED Disposition    ED Disposition Condition [facility]: [place] [100120]  Level of Care: Med-Surg [16]  Covid Evaluation: N/A  Diagnosis: Stroke (cerebrum) [facility]) [[id number]]  Admitting Physician: Gladstone Lighter [[id number]]  Attending Physician: Gladstone Lighter [phone]  PT Class (Do Not Modify): Observation [104]  PT Acc Code (Do Not Modify): Observation [10022]       B Medical/Surgery History Past Medical History:  Diagnosis Date  . Alcoholism ([place])   . Hep C w/ coma, chronic (HCC)    Past Surgical History:  Procedure Laterality Date  . COLONOSCOPY WITH PROPOFOL N/A 02/16/2017   Procedure: COLONOSCOPY WITH PROPOFOL;  Surgeon: Lollie Sails, MD;  Location: [facility] ENDOSCOPY;  Service: Endoscopy;  Laterality: N/A;  . ESOPHAGOGASTRODUODENOSCOPY (EGD) WITH PROPOFOL N/A 02/16/2017   Procedure: ESOPHAGOGASTRODUODENOSCOPY (EGD) WITH PROPOFOL;  Surgeon: Lollie Sails, MD;  Location: [facility] ENDOSCOPY;  Service: Endoscopy;  Laterality: N/A;  .  PARACENTESIS       A IV Location/Drains/Wounds Patient Lines/Drains/Airways Status   Active Line/Drains/Airways    Name:   Placement date:   Placement time:   Site:   Days:   Peripheral IV 02/21/19 Left Other (Comment)   02/21/19    0823    Other (Comment)   less than 1          Intake/Output Last 24 hours No intake or output data in the 24 hours ending 02/21/19 1512  Labs/Imaging Results for orders placed or performed during the hospital encounter of 02/21/19 (from the past 48 hour(s))  Protime-INR     Status: None   Collection Time: 02/21/19  7:53 AM  Result Value Ref Range   Prothrombin Time 13.1 11.4 - 15.2 seconds   INR 1.0 0.8 - 1.2    Comment: (NOTE) INR goal varies based on device and disease states. Performed at [facility], [address] [place], [place] [address]   APTT     Status: None   Collection Time: 02/21/19  7:53 AM  Result Value Ref Range   aPTT 31 24 - 36 seconds    Comment: Performed at [facility], [address] [place],  [address]  CBC     Status: Abnormal   Collection Time: 02/21/19  7:53 AM  Result Value Ref Range   WBC 4.2 4.0 - 10.5 K/uL   RBC 3.88 (L) 4.22 - 5.81 MIL/uL   Hemoglobin 11.7 (L) 13.0 - 17.0 g/dL   HCT 36.1 (L) 39.0 - 52.0 %   MCV 93.0 80.0 -  100.0 fL   MCH 30.2 26.0 - 34.0 pg   MCHC 32.4 30.0 - 36.0 g/dL   RDW 12.5 11.5 - 15.5 %   Platelets 104 (L) 150 - 400 K/uL    Comment: Immature Platelet Fraction may be clinically indicated, consider ordering this additional test [id number]    nRBC 0.0 0.0 - 0.2 %    Comment: Performed at [facility], [address] [place], [place] [address]  Differential     Status: None   Collection Time: 02/21/19  7:53 AM  Result Value Ref Range   Neutrophils Relative % 51 %   Neutro Abs 2.2 1.7 - 7.7 K/uL   Lymphocytes Relative 27 %   Lymphs Abs 1.1 0.7 - 4.0 K/uL   Monocytes Relative 17 %   Monocytes Absolute 0.7 0.1 - 1.0 K/uL   Eosinophils  Relative 4 %   Eosinophils Absolute 0.2 0.0 - 0.5 K/uL   Basophils Relative 1 %   Basophils Absolute 0.0 0.0 - 0.1 K/uL   Immature Granulocytes 0 %   Abs Immature Granulocytes 0.01 0.00 - 0.07 K/uL    Comment: Performed at [facility], [address] [place], [place] [address]  Comprehensive metabolic panel     Status: Abnormal   Collection Time: 02/21/19  7:53 AM  Result Value Ref Range   Sodium 137 135 - 145 mmol/L   Potassium 4.1 3.5 - 5.1 mmol/L   Chloride 104 98 - 111 mmol/L   CO2 24 22 - 32 mmol/L   Glucose, Bld 92 70 - 99 mg/dL   BUN 17 6 - 20 mg/dL   Creatinine, Ser 1.04 0.61 - 1.24 mg/dL   Calcium 9.5 8.9 - 10.3 mg/dL   Total Protein 8.0 6.5 - 8.1 g/dL   Albumin 3.8 3.5 - 5.0 g/dL   AST 87 (H) 15 - 41 U/L   ALT 44 0 - 44 U/L   Alkaline Phosphatase 104 38 - 126 U/L   Total Bilirubin 1.2 0.3 - 1.2 mg/dL   GFR calc non Af Amer >60 >60 mL/min   GFR calc Af Amer >60 >60 mL/min   Anion gap 9 5 - 15    Comment: Performed at [facility], [address] [place], [place] [address]  Lipid panel     Status: None   Collection Time: 02/21/19  7:53 AM  Result Value Ref Range   Cholesterol 198 0 - 200 mg/dL   Triglycerides 75 <150 mg/dL   HDL 100 >40 mg/dL   Total CHOL/HDL Ratio 2.0 RATIO   VLDL 15 0 - 40 mg/dL   LDL Cholesterol 83 0 - 99 mg/dL    Comment:        Total Cholesterol/HDL:CHD Risk Coronary Heart Disease Risk Table                     Men   Women  1/2 Average Risk   3.4   3.3  Average Risk       5.0   4.4  2 X Average Risk   9.6   7.1  3 X Average Risk  23.4   11.0        Use the calculated Patient Ratio above and the CHD Risk Table to determine the patient's CHD Risk.        ATP III CLASSIFICATION (LDL):  <100     mg/dL   Optimal  100-129  mg/dL   Near or Above  Optimal  130-159  mg/dL   Borderline  160-189  mg/dL   High  >190     mg/dL   Very High Performed at [facility], [address]  [place], [place] [address]    Mr Angio Head Wo Contrast  Result Date: 02/21/2019 CLINICAL DATA:  Dizziness beginning yesterday.  Visual disturbance. EXAM: MRA HEAD WITHOUT CONTRAST TECHNIQUE: Angiographic images of the Circle of Willis were obtained using MRA technique without intravenous contrast. COMPARISON:  MRI same day FINDINGS: Both internal carotid arteries are patent through the skull base and siphon regions. The anterior and middle cerebral vessels are patent without proximal stenosis, aneurysm or vascular malformation. Both vertebral arteries are patent to the basilar. Incidental basilar fenestration. No basilar stenosis. Posterior circulation branch vessels are patent without proximal stenosis. IMPRESSION: Negative intracranial MR angiography of the large and medium sized vessels. Electronically Signed   By: Nelson Chimes M.D.   On: 02/21/2019 14:39   Mr Brain Wo Contrast  Result Date: 02/21/2019 CLINICAL DATA:  Dizziness beginning yesterday.  Visual disturbance. EXAM: MRI HEAD WITHOUT CONTRAST TECHNIQUE: Multiplanar, multiecho pulse sequences of the brain and surrounding structures were obtained without intravenous contrast. COMPARISON:  None. FINDINGS: Brain: Diffusion imaging does not show any acute or subacute infarction. The brainstem shows mild chronic small-vessel change of the pons. No cerebellar insult. Cerebral hemispheres show mild to moderate chronic small-vessel ischemic changes of the deep and subcortical white matter. No cortical or large vessel territory infarction. No mass lesion, hemorrhage, hydrocephalus or extra-axial collection. Vascular: Major vessels at the base of the brain show flow. Skull and upper cervical spine: Negative Sinuses/Orbits: Clear/normal Other: None IMPRESSION: No acute finding. Chronic small-vessel ischemic changes of the pons and cerebral hemispheric white matter. No specific abnormality seen to explain the clinical presentation. Electronically Signed   By: Nelson Chimes M.D.   On: 02/21/2019 11:53    Pending Labs Unresulted Labs (From admission, onward)    Start     Ordered   02/21/19 1346  Urinalysis, Complete w Microscopic  Once,   STAT     02/21/19 1345   02/21/19 1346  Urine Drug Screen, Qualitative ([facility] only)  Once,   STAT     02/21/19 1345   02/21/19 1344  TSH  Once,   STAT     02/21/19 1343   02/21/19 1344  Sedimentation rate  Once,   STAT     02/21/19 1343   02/21/19 1344  C-reactive protein  Once,   STAT     02/21/19 1343   02/21/19 1243  Novel Coronavirus,NAA,(SEND-OUT TO REF LAB - TAT 24-48 hrs); Hosp Order  (Symptomatic Patients Labs with Precautions )  Once,   STAT    Question:  Current symptoms  Answer:  Other (testing not indicated)   02/21/19 1243   Signed and Held  HIV antibody (Routine Testing)  Once,   R     Signed and Held   Signed and Held  Basic metabolic panel  Tomorrow morning,   R     Signed and Held   Signed and Held  CBC  Tomorrow morning,   R     Signed and Held   Signed and Held  Hemoglobin A1c  Add-on,   R     Signed and Held          Vitals/Pain Today's Vitals   02/21/19 1201 02/21/19 1453 02/21/19 1500 02/21/19 1511  BP: (!) 181/93 (!) 181/93 (!) 169/83  Pulse: 70 67 62   Resp: 18 18 18    Temp:      TempSrc:      SpO2: 100% 100% 100%   Weight:      Height:      PainSc:  0-No pain  0-No pain    Isolation Precautions Airborne and Contact precautions  Medications Medications  sodium chloride flush (NS) 0.9 % injection 3 mL (3 mLs Intravenous Given 02/21/19 0824)    Mobility walks Low fall risk   Focused Assessments    R Recommendations: See Admitting Provider Note  Report given to:   Additional Notes:

## 2019-02-21 NOTE — ED Provider Notes (Signed)
[facility] Emergency Department Provider Note   ____________________________________________   I have reviewed the triage vital signs and the nursing notes.   HISTORY  Chief Complaint Dizziness and Diplopia   History limited by: Not Limited   HPI LOCKLAN CANOY is a 58 y.o. male who presents to the emergency department today because of concern for dizziness and double vision. The patient states that the symptoms started yesterday when he was mowing the yard. Had been feeling fine before hand. He felt that the dizziness was worse when he would get up or try to walk. Found that he had to hold onto objects for balance. The patient feels that the dizziness has gotten better today. He also is complaining of double vision which has persisted. The patient denies history of similar symptoms in the past.   Records reviewed. Per medical record review patient has a history of alcoholism  Past Medical History:  Diagnosis Date  . Alcoholism ([place])   . Hep C w/ coma, chronic (HCC)     There are no active problems to display for this patient.   Past Surgical History:  Procedure Laterality Date  . COLONOSCOPY WITH PROPOFOL N/A 02/16/2017   Procedure: COLONOSCOPY WITH PROPOFOL;  Surgeon: Lollie Sails, MD;  Location: [facility] ENDOSCOPY;  Service: Endoscopy;  Laterality: N/A;  . ESOPHAGOGASTRODUODENOSCOPY (EGD) WITH PROPOFOL N/A 02/16/2017   Procedure: ESOPHAGOGASTRODUODENOSCOPY (EGD) WITH PROPOFOL;  Surgeon: Lollie Sails, MD;  Location: [facility] ENDOSCOPY;  Service: Endoscopy;  Laterality: N/A;    Prior to Admission medications   Medication Sig Start Date End Date Taking? Authorizing Provider  ciprofloxacin (CIPRO) 500 MG tablet Take 500 mg by mouth 2 (two) times daily.    [provider]  folic acid (FOLVITE) 409 MCG tablet Take 400 mcg by mouth daily.    [provider]  omeprazole (PRILOSEC) 20 MG capsule Take 20 mg by mouth daily.     [provider]  potassium chloride SA (K-DUR,KLOR-CON) 20 MEQ tablet Take 20 mEq by mouth 2 (two) times daily.    [provider]  Sofosbuvir-Velpatasvir (EPCLUSA) 400-100 MG TABS Take 1 tablet by mouth daily.    [provider]  spironolactone (ALDACTONE) 25 MG tablet Take 25 mg by mouth daily.    [provider]  thiamine (VITAMIN B-1) 100 MG tablet Take 100 mg by mouth daily.    [provider]    Allergies Patient has no known allergies.  No family history on file.  Social History Social History   Tobacco Use  . Smoking status: Never Smoker  . Smokeless tobacco: Never Used  Substance Use Topics  . Alcohol use: Yes  . Drug use: No    Review of Systems Constitutional: No fever/chills Eyes: Positive for double vision. ENT: No sore throat. Cardiovascular: Denies chest pain. Respiratory: Denies shortness of breath. Gastrointestinal: No abdominal pain.  No nausea, no vomiting.  No diarrhea.   Genitourinary: Negative for dysuria. Musculoskeletal: Negative for back pain. Skin: Negative for rash. Neurological: Positive for dizziness.  ____________________________________________   PHYSICAL EXAM:  VITAL SIGNS: ED Triage Vitals  Enc Vitals Group     BP 02/21/19 0800 (!) 162/83     Pulse Rate 02/21/19 0800 61     Resp --      Temp 02/21/19 0800 98.9 F (37.2 C)     Temp Source 02/21/19 0800 Oral     SpO2 02/21/19 0800 99 %     Weight 02/21/19 0752 137  lb (62.1 kg)     Height 02/21/19 0752 5\' 7"  (1.702 m)     Head Circumference --      Peak Flow --      Pain Score 02/21/19 0752 0    Constitutional: Alert and oriented.  Eyes: Conjunctivae are normal.  ENT      Head: Normocephalic and atraumatic.      Nose: No congestion/rhinnorhea.      Mouth/Throat: Mucous membranes are moist.      Neck: No stridor. Hematological/Lymphatic/Immunilogical: No cervical lymphadenopathy. Cardiovascular: Normal rate, regular rhythm.  No  murmurs, rubs, or gallops.  Respiratory: Normal respiratory effort without tachypnea nor retractions. Breath sounds are clear and equal bilaterally. No wheezes/rales/rhonchi. Gastrointestinal: Soft and non tender. No rebound. No guarding.  Genitourinary: Deferred Musculoskeletal: Normal range of motion in all extremities. No lower extremity edema. Neurologic:  Normal speech and language. Left EOMI. Right eye unable to adduct.    Skin:  Skin is warm, dry and intact. No rash noted. Psychiatric: Mood and affect are normal. Speech and behavior are normal. Patient exhibits appropriate insight and judgment.  ____________________________________________    LABS (pertinent positives/negatives)  INR 1.0 CMP wnl except ast 87 CBC wbc 4.2, hgb 11.7, plt 104 ____________________________________________   EKG  I, Nance Pear, attending physician, personally viewed and interpreted this EKG  EKG Time: 0759 Rate: 61 Rhythm: normal sinus rhythm Axis: normal Intervals: qtc 422 QRS: narrow ST changes: no st elevation Impression: normal ekg   ____________________________________________    RADIOLOGY  MRI Brain No acute stroke  ____________________________________________   PROCEDURES  Procedures  ____________________________________________   INITIAL IMPRESSION / ASSESSMENT AND PLAN / ED COURSE  Pertinent labs & imaging results that were available during my care of the patient were reviewed by me and considered in my medical decision making (see chart for details).   Patient presented to the emergency department today with concerns for dizziness and double vision.  On exam patient is unable to abduct his right eye.  MRI of the brain was performed which did not show any acute stroke.  However in discussion with ophthalmologist Dr. George Ina still concern for CVA.  Will plan on admission.  Discussed with patient.   ____________________________________________   FINAL  CLINICAL IMPRESSION(S) / ED DIAGNOSES  Final diagnoses:  Double vision     Note: This dictation was prepared with Dragon dictation. Any transcriptional errors that result from this process are unintentional     Nance Pear, MD 02/21/19 1321

## 2019-02-21 NOTE — ED Triage Notes (Signed)
Pt to ED via POV c/o dizziness that started yesterday around 0930. Pt states that he was mowing the yard and he became dizzy. Pt states that he also started having some double vision and problems with his balance around 1000. Pt denies any unilateral weakness or numbness. No facial droop or slurred speech noted at this time. Pt denies nausea or vomiting. Pt states that the dizziness comes and goes, is worse when he stands. Pt is in NAD at this time.

## 2019-02-21 NOTE — Plan of Care (Signed)
  Problem: Activity: Goal: Risk for activity intolerance will decrease Outcome: Progressing   

## 2019-02-21 NOTE — ED Notes (Signed)
Patient refusing admission at this time. Archie Balboa MD notified and at bedside.

## 2019-02-21 NOTE — ED Notes (Signed)
Patient taken to MRI

## 2019-02-21 NOTE — H&P (Signed)
Johnny [name] NAME: Johnny [name]    MR#:  [id number]  DATE OF BIRTH:  11-06-1960  DATE OF ADMISSION:  02/21/2019  PRIMARY CARE PHYSICIAN: Patient, No Pcp Per   REQUESTING/REFERRING PHYSICIAN: Dr. Nance Pear  CHIEF COMPLAINT:   Chief Complaint  Patient presents with  . Dizziness  . Diplopia    HISTORY OF PRESENT ILLNESS:  Johnny [name]  is a 58 y.o. male with a known history of alcohol abuse, chronic hepatitis C finished treatment hospital hospital secondary to dizziness, difficulty walking and blurred vision in the right eye starting last night. Patient states that he is independent at baseline.  Since last night he started having some dizziness and noted that his gait was off.  Denies any headache.  No tingling or numbness.  No speech or swallowing difficulties.  This morning he woke up and he started seeing blurred due to double vision in his right eye, and so presents to the emergency room.  Does not take any medications at home.  He was treated for his hepatitis C last year.  No fevers or chills, no recent travel.  No exposure to sick contacts.  Nausea or vomiting.  No chest pain or cough. Labs are within normal limits in the ED.  MRI is negative except chronic small vessel ischemic changes of pons and cerebral hemisphere.  PAST MEDICAL HISTORY:   Past Medical History:  Diagnosis Date  . Alcoholism ([place])   . Hep C w/ coma, chronic ([place])     PAST SURGICAL HISTORY:   Past Surgical History:  Procedure Laterality Date  . COLONOSCOPY WITH PROPOFOL N/A 02/16/2017   Procedure: COLONOSCOPY WITH PROPOFOL;  Surgeon: Lollie Sails, MD;  Location: [facility] ENDOSCOPY;  Service: Endoscopy;  Laterality: N/A;  . ESOPHAGOGASTRODUODENOSCOPY (EGD) WITH PROPOFOL N/A 02/16/2017   Procedure: ESOPHAGOGASTRODUODENOSCOPY (EGD) WITH PROPOFOL;  Surgeon: Lollie Sails, MD;  Location: [facility] ENDOSCOPY;  Service: Endoscopy;  Laterality:  N/A;  . PARACENTESIS      SOCIAL HISTORY:   Social History   Tobacco Use  . Smoking status: Never Smoker  . Smokeless tobacco: Never Used  Substance Use Topics  . Alcohol use: Yes    Comment: 10 beers per day    FAMILY HISTORY:  No family history on file.  DRUG ALLERGIES:  No Known Allergies  REVIEW OF SYSTEMS:   Review of Systems  Constitutional: Positive for malaise/fatigue. Negative for chills, fever and weight loss.  HENT: Negative for ear discharge, ear pain, hearing loss, nosebleeds and tinnitus.   Eyes: Positive for double vision. Negative for blurred vision and photophobia.  Respiratory: Negative for cough, hemoptysis, shortness of breath and wheezing.   Cardiovascular: Negative for chest pain, palpitations, orthopnea and leg swelling.  Gastrointestinal: Negative for abdominal pain, constipation, diarrhea, heartburn, melena, nausea and vomiting.  Genitourinary: Negative for dysuria, frequency, hematuria and urgency.  Musculoskeletal: Negative for back pain, myalgias and neck pain.  Skin: Negative for rash.  Neurological: Positive for dizziness. Negative for tingling, tremors, sensory change, speech change, focal weakness and headaches.  Endo/Heme/Allergies: Does not bruise/bleed easily.  Psychiatric/Behavioral: Negative for depression.    MEDICATIONS AT HOME:   Prior to Admission medications   Medication Sig Start Date End Date Taking? Authorizing Provider  ciprofloxacin (CIPRO) 500 MG tablet Take 500 mg by mouth 2 (two) times daily.    [provider]  folic acid (FOLVITE) 629 MCG tablet Take 400 mcg by mouth daily.  [provider]  omeprazole (PRILOSEC) 20 MG capsule Take 20 mg by mouth daily.    [provider]  potassium chloride SA (K-DUR,KLOR-CON) 20 MEQ tablet Take 20 mEq by mouth 2 (two) times daily.    [provider]  Sofosbuvir-Velpatasvir (EPCLUSA) 400-100 MG TABS Take 1 tablet by mouth daily.    [provider]  spironolactone (ALDACTONE) 25 MG tablet Take 25 mg by mouth daily.    [provider]  thiamine (VITAMIN B-1) 100 MG tablet Take 100 mg by mouth daily.    [provider]      VITAL SIGNS:  Blood pressure (!) 181/93, pulse 67, temperature 98.9 F (37.2 C), temperature source Oral, resp. rate 18, height 5\' 7"  (1.702 m), weight 62.1 kg, SpO2 100 %.  PHYSICAL EXAMINATION:  Physical Exam  GENERAL:  57 y.o.-year-old patient lying in the bed with no acute distress.  EYES: Pupils equal, round, reactive to light and accommodation. No scleral icterus. Right eye adduction paralyzed. HEENT: Head atraumatic, normocephalic. Oropharynx and nasopharynx clear.  NECK:  Supple, no jugular venous distention. No thyroid enlargement, no tenderness.  LUNGS: Normal breath sounds bilaterally, no wheezing, rales,rhonchi or crepitation. No use of accessory muscles of respiration. Decreased bibasilar breath sounds CARDIOVASCULAR: S1, S2 normal. No murmurs, rubs, or gallops.  ABDOMEN: Soft, nontender, nondistended. Bowel sounds present. No organomegaly or mass.  EXTREMITIES: No pedal edema, cyanosis, or clubbing. No tremors noted NEUROLOGIC: Cranial nerves II through XII are intact. Muscle strength 5/5 in all extremities. Sensation intact. Gait not checked. DTR equal  on both lower extremities.  PSYCHIATRIC: The patient is alert and oriented x 3.  SKIN: No obvious rash, lesion, or ulcer.   LABORATORY PANEL:   CBC Recent Labs  Lab 02/21/19 0753  WBC 4.2  HGB 11.7*  HCT 36.1*  PLT 104*   ------------------------------------------------------------------------------------------------------------------  Chemistries  Recent Labs  Lab 02/21/19 0753  NA 137  K 4.1  CL 104  CO2 24  GLUCOSE 92  BUN 17  CREATININE 1.04  CALCIUM 9.5  AST 87*  ALT 44  ALKPHOS 104  BILITOT 1.2    ------------------------------------------------------------------------------------------------------------------  Cardiac Enzymes No results for input(s): TROPONINI in the last 168 hours. ------------------------------------------------------------------------------------------------------------------  RADIOLOGY:  Mr Angio Head Wo Contrast  Result Date: 02/21/2019 CLINICAL DATA:  Dizziness beginning yesterday.  Visual disturbance. EXAM: MRA HEAD WITHOUT CONTRAST TECHNIQUE: Angiographic images of the Circle of Willis were obtained using MRA technique without intravenous contrast. COMPARISON:  MRI same day FINDINGS: Both internal carotid arteries are patent through the skull base and siphon regions. The anterior and middle cerebral vessels are patent without proximal stenosis, aneurysm or vascular malformation. Both vertebral arteries are patent to the basilar. Incidental basilar fenestration. No basilar stenosis. Posterior circulation branch vessels are patent without proximal stenosis. IMPRESSION: Negative intracranial MR angiography of the large and medium sized vessels. Electronically Signed   By: Nelson Chimes M.D.   On: 02/21/2019 14:39   Mr Brain Wo Contrast  Result Date: 02/21/2019 CLINICAL DATA:  Dizziness beginning yesterday.  Visual disturbance. EXAM: MRI HEAD WITHOUT CONTRAST TECHNIQUE: Multiplanar, multiecho pulse sequences of the brain and surrounding structures were obtained without intravenous contrast. COMPARISON:  None. FINDINGS: Brain: Diffusion imaging does not show any acute or subacute infarction. The brainstem shows mild chronic small-vessel change of the pons. No cerebellar insult. Cerebral hemispheres show mild to moderate chronic small-vessel ischemic changes of the deep and subcortical white matter. No cortical or large vessel territory infarction.  No mass lesion, hemorrhage, hydrocephalus or extra-axial collection. Vascular: Major vessels at the base of the brain show flow.  Skull and upper cervical spine: Negative Sinuses/Orbits: Clear/normal Other: None IMPRESSION: No acute finding. Chronic small-vessel ischemic changes of the pons and cerebral hemispheric white matter. No specific abnormality seen to explain the clinical presentation. Electronically Signed   By: Nelson Chimes M.D.   On: 02/21/2019 11:53      IMPRESSION AND PLAN:   Johnny [name]  is a 58 y.o. male with a known history of alcohol abuse, chronic hepatitis C finished treatment hospital hospital secondary to dizziness, difficulty walking and blurred vision in the right eye starting last night.  1.  Diplopia with right eye adduction paralysis-concern for stroke.  MRI is negative. -Pupil reaction is spared.  Concern for 3rd nerve involvement.  Given his ataxia/ dizziness- cerebellar involvement? -Chronic small vessel ischemic changes noted on MRI -MRA, carotid Dopplers and echocardiogram ordered -Neurology consult.  Neurochecks -PT/OT and speech therapy consults requested -Aspirin and statin.  Check lipid panel  2.  Alcohol abuse-counseled.  Patient denies any withdrawal symptoms.  3. Cirrhosis with  Alcoholism and hepatitis C -prior history of paracentesis.  Stable.  Continue aldactone  4.  Hepatitis C followed with GI in 2018 and finished  treatment with Epclusa  5.  DVT prophylaxis-Lovenox     All the records are reviewed and case discussed with ED provider. Management plans discussed with the patient, family and they are in agreement.  CODE STATUS: Full Code  TOTAL TIME TAKING CARE OF THIS PATIENT: 52 minutes.    Gladstone Lighter M.D on 02/21/2019 at 2:58 PM  Between 7am to 6pm - Pager - [phone]  After 6pm go to [email or web address] - Proofreader  [facility]  Office  [phone]  CC: Primary care physician; Patient, No Pcp Per   Note: This dictation was prepared with Dragon dictation along with smaller phrase technology. Any  transcriptional errors that result from this process are unintentional.

## 2019-02-21 NOTE — ED Notes (Signed)
Report given to Stephen RN.

## 2019-02-21 NOTE — ED Notes (Signed)
Patient transported to MRI 

## 2019-02-21 NOTE — ED Notes (Signed)
Patient's cousin, Onalee Hua, called to speak with patient. Patient is out of the room. Onalee Hua was informed that no information could be given, but patient would be given the message that she called.

## 2019-02-21 NOTE — ED Notes (Signed)
Patient states he is willing to stay. His wife won't come to pick him up. Dr. Archie Balboa aware.

## 2019-02-22 ENCOUNTER — Observation Stay: Payer: BC Managed Care – PPO

## 2019-02-22 DIAGNOSIS — H532 Diplopia: Secondary | ICD-10-CM

## 2019-02-22 LAB — BASIC METABOLIC PANEL
Anion gap: 10 (ref 5–15)
BUN: 21 mg/dL — ABNORMAL HIGH (ref 6–20)
CO2: 24 mmol/L (ref 22–32)
Calcium: 9.3 mg/dL (ref 8.9–10.3)
Chloride: 102 mmol/L (ref 98–111)
Creatinine, Ser: 1.15 mg/dL (ref 0.61–1.24)
GFR calc Af Amer: >60 mL/min (ref >60)
GFR calc non Af Amer: >60 mL/min (ref >60)
Glucose, Bld: 95 mg/dL (ref 70–99)
Potassium: 4.4 mmol/L (ref 3.5–5.1)
Sodium: 136 mmol/L (ref 135–145)

## 2019-02-22 LAB — CBC
HCT: 33.6 % — ABNORMAL LOW (ref 39.0–52.0)
Hemoglobin: 11.2 g/dL — ABNORMAL LOW (ref 13.0–17.0)
MCH: 30.2 pg (ref 26.0–34.0)
MCHC: 33.3 g/dL (ref 30.0–36.0)
MCV: 90.6 fL (ref 80.0–100.0)
Platelets: 108 10*3/uL — ABNORMAL LOW (ref 150–400)
RBC: 3.71 MIL/uL — ABNORMAL LOW (ref 4.22–5.81)
RDW: 12.6 % (ref 11.5–15.5)
WBC: 4.7 10*3/uL (ref 4.0–10.5)
nRBC: 0 % (ref 0.0–0.2)

## 2019-02-22 LAB — C-REACTIVE PROTEIN: CRP: <0.8 mg/dL (ref <1.0)

## 2019-02-22 LAB — HEMOGLOBIN A1C
Hgb A1c MFr Bld: 5.3 % (ref 4.8–5.6)
Mean Plasma Glucose: 105.41 mg/dL

## 2019-02-22 MED ORDER — ASPIRIN 81 MG PO CHEW: 81.0000 mg | CHEWABLE_TABLET | Freq: Every day | ORAL | 3 refills | Status: AC

## 2019-02-22 MED ORDER — SPIRONOLACTONE 25 MG PO TABS: 25.0000 mg | ORAL_TABLET | Freq: Every day | ORAL | 2 refills | Status: AC

## 2019-02-22 MED ORDER — ATORVASTATIN CALCIUM 40 MG PO TABS: 40.0000 mg | ORAL_TABLET | Freq: Every day | ORAL | 3 refills | Status: DC

## 2019-02-22 NOTE — Plan of Care (Signed)
  Problem: Activity: Goal: Risk for activity intolerance will decrease Outcome: Progressing   Problem: Nutrition: Goal: Adequate nutrition will be maintained Outcome: Progressing   Problem: Pain Managment: Goal: General experience of comfort will improve Outcome: Progressing   Problem: Safety: Goal: Ability to remain free from injury will improve Outcome: Progressing   Problem: Education: Goal: Knowledge of disease or condition will improve Outcome: Progressing Goal: Knowledge of secondary prevention will improve Outcome: Progressing

## 2019-02-22 NOTE — Consult Note (Signed)
Referring Physician: Tressia Miners    Chief Complaint: Blurred vision, dizziness  HPI: Johnny [name] is an 58 y.o. male with a history of HepC who reports that on 7/2 while mowing the year he had acute onset of dizziness affecting gait and blurred.  Went inside the house to rest.  As of the next day the patient continued with symptoms and presented for evaluation.  Initial NIHSS of 0.  .    Date last known well: Date: 02/20/2019 Time last known well: Unable to determine tPA Given: No: Outside time window  Past Medical History:  Diagnosis Date  . Alcoholism ([place])   . Hep C w/ coma, chronic ([facility])     Past Surgical History:  Procedure Laterality Date  . COLONOSCOPY WITH PROPOFOL N/A 02/16/2017   Procedure: COLONOSCOPY WITH PROPOFOL;  Surgeon: Lollie Sails, MD;  Location: [facility] ENDOSCOPY;  Service: Endoscopy;  Laterality: N/A;  . ESOPHAGOGASTRODUODENOSCOPY (EGD) WITH PROPOFOL N/A 02/16/2017   Procedure: ESOPHAGOGASTRODUODENOSCOPY (EGD) WITH PROPOFOL;  Surgeon: Lollie Sails, MD;  Location: [facility] ENDOSCOPY;  Service: Endoscopy;  Laterality: N/A;  . PARACENTESIS      No family history on file.   Social History:  reports that he has never smoked. He has never used smokeless tobacco. He reports current alcohol use. He reports that he does not use drugs.  Allergies: No Known Allergies  Medications:  I have reviewed the patient's current medications. Prior to Admission:  No medications prior to admission.   Scheduled: . aspirin  81 mg Oral Daily  . atorvastatin  40 mg Oral q1800  . docusate sodium  100 mg Oral BID  . enoxaparin (LOVENOX) injection  40 mg Subcutaneous Q24H  . folic acid  3,159 mcg Oral Daily  . pantoprazole  40 mg Oral Daily  . spironolactone  25 mg Oral Daily  . thiamine  100 mg Oral Daily    ROS: History obtained from the patient  General ROS: negative for - chills, fatigue, fever, night sweats, weight gain or weight loss Psychological ROS: negative  for - behavioral disorder, hallucinations, memory difficulties, mood swings or suicidal ideation Ophthalmic ROS: blurry vision ENT ROS: dizziness Allergy and Immunology ROS: negative for - hives or itchy/watery eyes Hematological and Lymphatic ROS: negative for - bleeding problems, bruising or swollen lymph nodes Endocrine ROS: negative for - galactorrhea, hair pattern changes, polydipsia/polyuria or temperature intolerance Respiratory ROS: negative for - cough, hemoptysis, shortness of breath or wheezing Cardiovascular ROS: negative for - chest pain, dyspnea on exertion, edema or irregular heartbeat Gastrointestinal ROS: negative for - abdominal pain, diarrhea, hematemesis, nausea/vomiting or stool incontinence Genito-Urinary ROS: negative for - dysuria, hematuria, incontinence or urinary frequency/urgency Musculoskeletal ROS: negative for - joint swelling or muscular weakness Neurological ROS: as noted in HPI Dermatological ROS: negative for rash and skin lesion changes  Physical Examination: Blood pressure 132/84, pulse 72, temperature 98.6 F (37 C), temperature source Oral, resp. rate 16, height '5\' 7"'  (1.702 m), weight 62.1 kg, SpO2 98 %.  HEENT-  Normocephalic, no lesions, without obvious abnormality.  Normal external eye and conjunctiva.  Normal TM's bilaterally.  Normal auditory canals and external ears. Normal external nose, mucus membranes and septum.  Normal pharynx. Cardiovascular- S1, S2 normal, pulses palpable throughout   Lungs- chest clear, no wheezing, rales, normal symmetric air entry Abdomen- soft, non-tender; bowel sounds normal; no masses,  no organomegaly Extremities- no edema Lymph-no adenopathy palpable Musculoskeletal-no joint tenderness, deformity or swelling Skin-warm and dry, no hyperpigmentation, vitiligo, or  suspicious lesions  Neurological Examination   Mental Status: Alert, oriented, thought content appropriate.  Speech fluent without evidence of aphasia.   Able to follow 3 step commands without difficulty. Cranial Nerves: II: Discs flat bilaterally; Visual fields grossly normal, pupils equal, round, reactive to light and accommodation III,IV, VI: ptosis not present, Right eye unable to deviate nasally.  Decreased upward movement with left eye. Nystagmus on upward gaze.  V,VII: smile symmetric, facial light touch sensation normal bilaterally VIII: hearing normal bilaterally IX,X: gag reflex present XI: bilateral shoulder shrug XII: midline tongue extension Motor: Right : Upper extremity   5/5    Left:     Upper extremity   5/5  Lower extremity   5/5     Lower extremity   5/5 Tone and bulk:normal tone throughout; no atrophy noted Sensory: Pinprick and light touch intact throughout, bilaterally Deep Tendon Reflexes: 2+ and symmetric with absent AJ's bilaterally Plantars: Right: downgoing   Left: downgoing Cerebellar: Normal finger-to-nose and normal heel-to-shin testing bilaterally Gait: not tested due to safety concerns   Laboratory Studies:  Basic Metabolic Panel: Recent Labs  Lab 02/21/19 0753 02/22/19 0540  NA 137 136  K 4.1 4.4  CL 104 102  CO2 24 24  GLUCOSE 92 95  BUN 17 21*  CREATININE 1.04 1.15  CALCIUM 9.5 9.3    Liver Function Tests: Recent Labs  Lab 02/21/19 0753  AST 87*  ALT 44  ALKPHOS 104  BILITOT 1.2  PROT 8.0  ALBUMIN 3.8   No results for input(s): LIPASE, AMYLASE in the last 168 hours. No results for input(s): AMMONIA in the last 168 hours.  CBC: Recent Labs  Lab 02/21/19 0753 02/22/19 0540  WBC 4.2 4.7  NEUTROABS 2.2  --   HGB 11.7* 11.2*  HCT 36.1* 33.6*  MCV 93.0 90.6  PLT 104* 108*    Cardiac Enzymes: No results for input(s): CKTOTAL, CKMB, CKMBINDEX, TROPONINI in the last 168 hours.  BNP: Invalid input(s): POCBNP  CBG: No results for input(s): GLUCAP in the last 168 hours.  Microbiology: Results for orders placed or performed during the hospital encounter of 10/05/17  Body  fluid culture     Status: None   Collection Time: 10/05/17  8:37 AM   Specimen: [facility] Cytology Peritoneal fluid  Result Value Ref Range Status   Specimen Description   Final    PERITONEAL FLUID Performed at [facility], [address] [place], [place] [address]    Special Requests   Final    PERITONEAL FLUID Performed at [facility], [address] [place], [place] [address]    Gram Stain   Final    RARE WBC PRESENT, PREDOMINANTLY MONONUCLEAR NO ORGANISMS SEEN    Culture   Final    No growth aerobically or anaerobically. Performed at [facility], [place] [address] [place],  [address]    Report Status 10/08/2017 FINAL  Final    Coagulation Studies: Recent Labs    02/21/19 0753  LABPROT 13.1  INR 1.0    Urinalysis:  Recent Labs  Lab 02/21/19 1514  COLORURINE YELLOW*  LABSPEC 1.017  PHURINE 6.0  GLUCOSEU NEGATIVE  HGBUR NEGATIVE  BILIRUBINUR NEGATIVE  KETONESUR NEGATIVE  PROTEINUR 30*  NITRITE NEGATIVE  LEUKOCYTESUR NEGATIVE    Lipid Panel:    Component Value Date/Time   CHOL 198 02/21/2019 0753   TRIG 75 02/21/2019 0753   HDL 100 02/21/2019 0753   CHOLHDL 2.0 02/21/2019 0753  VLDL 15 02/21/2019 0753   LDLCALC 83 02/21/2019 0753    HgbA1C:  Lab Results  Component Value Date   HGBA1C 5.3 02/21/2019    Urine Drug Screen:      Component Value Date/Time   LABOPIA NONE DETECTED 02/21/2019 1514   COCAINSCRNUR NONE DETECTED 02/21/2019 1514   LABBENZ NONE DETECTED 02/21/2019 1514   AMPHETMU NONE DETECTED 02/21/2019 1514   THCU NONE DETECTED 02/21/2019 1514   LABBARB NONE DETECTED 02/21/2019 1514    Alcohol Level: No results for input(s): ETH in the last 168 hours.  Other results: EKG: normal sinus rhythm at 61 bpm.  Imaging: Mr Angio Head Wo Contrast  Result Date: 02/21/2019 CLINICAL DATA:  Dizziness beginning yesterday.  Visual disturbance. EXAM: MRA HEAD WITHOUT CONTRAST TECHNIQUE: Angiographic images of the  Circle of Willis were obtained using MRA technique without intravenous contrast. COMPARISON:  MRI same day FINDINGS: Both internal carotid arteries are patent through the skull base and siphon regions. The anterior and middle cerebral vessels are patent without proximal stenosis, aneurysm or vascular malformation. Both vertebral arteries are patent to the basilar. Incidental basilar fenestration. No basilar stenosis. Posterior circulation branch vessels are patent without proximal stenosis. IMPRESSION: Negative intracranial MR angiography of the large and medium sized vessels. Electronically Signed   By: Nelson Chimes M.D.   On: 02/21/2019 14:39   Mr Brain Wo Contrast  Result Date: 02/21/2019 CLINICAL DATA:  Dizziness beginning yesterday.  Visual disturbance. EXAM: MRI HEAD WITHOUT CONTRAST TECHNIQUE: Multiplanar, multiecho pulse sequences of the brain and surrounding structures were obtained without intravenous contrast. COMPARISON:  None. FINDINGS: Brain: Diffusion imaging does not show any acute or subacute infarction. The brainstem shows mild chronic small-vessel change of the pons. No cerebellar insult. Cerebral hemispheres show mild to moderate chronic small-vessel ischemic changes of the deep and subcortical white matter. No cortical or large vessel territory infarction. No mass lesion, hemorrhage, hydrocephalus or extra-axial collection. Vascular: Major vessels at the base of the brain show flow. Skull and upper cervical spine: Negative Sinuses/Orbits: Clear/normal Other: None IMPRESSION: No acute finding. Chronic small-vessel ischemic changes of the pons and cerebral hemispheric white matter. No specific abnormality seen to explain the clinical presentation. Electronically Signed   By: Nelson Chimes M.D.   On: 02/21/2019 11:53    Assessment: 58 y.o. male presenting with extraocular abnormalities and dizziness.  Dizziness has resolved but patient continues to have left gaze limitations from the right eye  and upward gaze limitations from the left eye.  MRI of the brain reviewed and shows no acute changes.  MRA of the brain unremarkable as well.  TSH, ESR and CRP normal. Although MR negative infarct is in the differential can not rule out MG and thiamine deficiency as well.  Carotid dopplers show no evidence of hemodynamically significant stenosis.  Echocardiogram pending.  A1c 5.3, LDL 83.  Stroke Risk Factors - none  Plan: 1. PT consult, OT consult, Speech consult 2. Echocardiogram pending 3. Prophylactic therapy-Patient wth low platelets therefore would not place on DUAP.  ASA 66m recommended.   4. NPO until RN stroke swallow screen 5. Telemetry monitoring 6. Frequent neuro checks 7. Myasthenia panel, thiamine level 8. Follow up with neurology on an outpatient basis   LAlexis Goodell MD Neurology [phone]/11/2018, 9:46 AM

## 2019-02-22 NOTE — Progress Notes (Addendum)
[facility] [place] NAME: Johnny [name]    MR#:  [id number]  DATE OF BIRTH:  10/10/1960  SUBJECTIVE:  CHIEF COMPLAINT:   Chief Complaint  Patient presents with  . Dizziness  . Diplopia   -Feels much better today.  No further dizziness.  Awaiting PT OT and neurology consults -Right eye blurred vision persists  REVIEW OF SYSTEMS:  Review of Systems  Constitutional: Negative for chills, fever and malaise/fatigue.  HENT: Negative for congestion, ear discharge, hearing loss and nosebleeds.   Eyes: Positive for blurred vision and double vision.  Respiratory: Negative for cough, shortness of breath and wheezing.   Cardiovascular: Negative for chest pain, palpitations and leg swelling.  Gastrointestinal: Negative for abdominal pain, constipation, diarrhea, nausea and vomiting.  Genitourinary: Negative for dysuria.  Neurological: Negative for dizziness, focal weakness, seizures, weakness and headaches.    DRUG ALLERGIES:  No Known Allergies  VITALS:  Blood pressure 132/84, pulse 72, temperature 98.6 F (37 C), temperature source Oral, resp. rate 16, height 5\' 7"  (1.702 m), weight 62.1 kg, SpO2 98 %.  PHYSICAL EXAMINATION:  Physical Exam  GENERAL:  58 y.o.-year-old patient lying in the bed with no acute distress.  EYES: Pupils equal, round, reactive to light and accommodation. No scleral icterus. Right eye adduction paralyzed. HEENT: Head atraumatic, normocephalic. Oropharynx and nasopharynx clear.  NECK:  Supple, no jugular venous distention. No thyroid enlargement, no tenderness.  LUNGS: Normal breath sounds bilaterally, no wheezing, rales,rhonchi or crepitation. No use of accessory muscles of respiration. Decreased bibasilar breath sounds CARDIOVASCULAR: S1, S2 normal. No murmurs, rubs, or gallops.  ABDOMEN: Soft, nontender, nondistended. Bowel sounds present. No organomegaly or mass.  EXTREMITIES: No pedal edema, cyanosis, or  clubbing. No tremors noted NEUROLOGIC: Cranial nerves II through XII are intact. Muscle strength 5/5 in all extremities. Sensation intact. Gait not checked. DTR equal  on both lower extremities.  PSYCHIATRIC: The patient is alert and oriented x 3.  SKIN: No obvious rash, lesion, or ulcer.    LABORATORY PANEL:   CBC Recent Labs  Lab 02/22/19 0540  WBC 4.7  HGB 11.2*  HCT 33.6*  PLT 108*   ------------------------------------------------------------------------------------------------------------------  Chemistries  Recent Labs  Lab 02/21/19 0753 02/22/19 0540  NA 137 136  K 4.1 4.4  CL 104 102  CO2 24 24  GLUCOSE 92 95  BUN 17 21*  CREATININE 1.04 1.15  CALCIUM 9.5 9.3  AST 87*  --   ALT 44  --   ALKPHOS 104  --   BILITOT 1.2  --    ------------------------------------------------------------------------------------------------------------------  Cardiac Enzymes No results for input(s): TROPONINI in the last 168 hours. ------------------------------------------------------------------------------------------------------------------  RADIOLOGY:  Mr Angio Head Wo Contrast  Result Date: 02/21/2019 CLINICAL DATA:  Dizziness beginning yesterday.  Visual disturbance. EXAM: MRA HEAD WITHOUT CONTRAST TECHNIQUE: Angiographic images of the Circle of Willis were obtained using MRA technique without intravenous contrast. COMPARISON:  MRI same day FINDINGS: Both internal carotid arteries are patent through the skull base and siphon regions. The anterior and middle cerebral vessels are patent without proximal stenosis, aneurysm or vascular malformation. Both vertebral arteries are patent to the basilar. Incidental basilar fenestration. No basilar stenosis. Posterior circulation branch vessels are patent without proximal stenosis. IMPRESSION: Negative intracranial MR angiography of the large and medium sized vessels. Electronically Signed   By: Nelson Chimes M.D.   On: 02/21/2019 14:39    Mr Brain Wo Contrast  Result Date: 02/21/2019 CLINICAL DATA:  Dizziness beginning yesterday.  Visual disturbance. EXAM: MRI HEAD WITHOUT CONTRAST TECHNIQUE: Multiplanar, multiecho pulse sequences of the brain and surrounding structures were obtained without intravenous contrast. COMPARISON:  None. FINDINGS: Brain: Diffusion imaging does not show any acute or subacute infarction. The brainstem shows mild chronic small-vessel change of the pons. No cerebellar insult. Cerebral hemispheres show mild to moderate chronic small-vessel ischemic changes of the deep and subcortical white matter. No cortical or large vessel territory infarction. No mass lesion, hemorrhage, hydrocephalus or extra-axial collection. Vascular: Major vessels at the base of the brain show flow. Skull and upper cervical spine: Negative Sinuses/Orbits: Clear/normal Other: None IMPRESSION: No acute finding. Chronic small-vessel ischemic changes of the pons and cerebral hemispheric white matter. No specific abnormality seen to explain the clinical presentation. Electronically Signed   By: Nelson Chimes M.D.   On: 02/21/2019 11:53    EKG:   Orders placed or performed during the hospital encounter of 02/21/19  . ED EKG  . ED EKG    ASSESSMENT AND PLAN:   Johnny [name]  is a 58 y.o. male with a known history of alcohol abuse, chronic hepatitis C finished treatment hospital hospital secondary to dizziness, difficulty walking and blurred vision in the right eye starting last night.  1.  Diplopia with right eye adduction paralysis- ?TIA  - MRI is negative.  MRI negative as well with no tiny aneurysms noted -Pupil reaction is spared.  Concern for 3rd nerve involvement.  Given his ataxia/ dizziness- cerebellar involvement?  Might need ophthalmology follow-up as outpatient -Chronic small vessel ischemic changes noted on MRI -Pending carotid Dopplers and echocardiogram ordered -Neurology consult.  Neurochecks -PT/OT and speech therapy  consults requested -Aspirin and statin.  Check lipid panel  2.  Alcohol abuse-counseled.  Patient denies any withdrawal symptoms.  3. Cirrhosis with  Alcoholism and hepatitis C -prior history of paracentesis.  Stable.  Continue aldactone  4.  Hepatitis C followed with GI in 2018 and finished  treatment with Epclusa  5.  DVT prophylaxis-Lovenox  Anticipating discharge today after neurology evaluation.   All the records are reviewed and case discussed with Care Management/Social Workerr. Management plans discussed with the patient, family and they are in agreement.  CODE STATUS: full code  TOTAL TIME TAKING CARE OF THIS PATIENT: 38 minutes.   POSSIBLE D/C IN 1-2 DAYS, DEPENDING ON CLINICAL CONDITION.   Gladstone Lighter M.D on 02/22/2019 at 10:25 AM  Between 7am to 6pm - Pager - [phone]  After 6pm go to [email or web address] - password [email or web address] [facility]  Office  [phone]  CC: Primary care physician; Patient, No Pcp Per

## 2019-02-22 NOTE — Evaluation (Signed)
Clinical/Bedside Swallow Evaluation Patient Details  Name: Johnny [name] MRN: [id number] Date of Birth: [birth year]  Today's Date: 02/22/2019 Time: SLP Start Time (ACUTE ONLY): 0935 SLP Stop Time (ACUTE ONLY): 1020 SLP Time Calculation (min) (ACUTE ONLY): 45 min  Past Medical History:  Past Medical History:  Diagnosis Date  . Alcoholism ([place])   . Hep C w/ coma, chronic (HCC)    Past Surgical History:  Past Surgical History:  Procedure Laterality Date  . COLONOSCOPY WITH PROPOFOL N/A 02/16/2017   Procedure: COLONOSCOPY WITH PROPOFOL;  Surgeon: Lollie Sails, MD;  Location[facility] ENDOSCOPY;  Service: Endoscopy;  Laterality: N/A;  . ESOPHAGOGASTRODUODENOSCOPY (EGD) WITH PROPOFOL N/A 02/16/2017   Procedure: ESOPHAGOGASTRODUODENOSCOPY (EGD) WITH PROPOFOL;  Surgeon: Lollie Sails, MD;  Location: [facility] ENDOSCOPY;  Service: Endoscopy;  Laterality: N/A;  . PARACENTESIS     HPI:  Pt is a 58 y.o. male with a known history of alcohol abuse, chronic hepatitis C admitted to the hospital secondary to dizziness, difficulty walking and blurred vision in the right eye starting last night.  He is independent at home; stated he has been drinking Ensure "some". Pt is verbally conversive and follows commands.    Assessment / Plan / Recommendation Clinical Impression  Pt appears to present w/ adequate oropharyngeal phase swallowing w/ reduced risk for aspiration from an oropharyngeal phase standpoint. Pt does have baseline ETOH abuse which can increase risk for Esophageal phase dysmotility thus Reflux, aspiration of Reflux. Pt consumed po trials w/ no overt s/s of aspiration noted; no decline in vocal quality or respiratory status during/post trials. Oral phase was [facility] for bolus management and oral clearing. Pt fed self - his R vision did not bother his self-feeding per his report. OM exam appeared [facility]. Recommend continue w/ current diet as ordered w/ general aspiration precautions. No further skilled  ST services indicated at this time. NSG to reconsult if needs arise. Pt agreed.  SLP Visit Diagnosis: Dysphagia, unspecified (R13.10)    Aspiration Risk  (reduced )    Diet Recommendation  regular diet w/ thin liquids; general aspiration and Reflux precautions  Medication Administration: Whole meds with liquid    Other  Recommendations Recommended Consults: (Dietician f/u) Oral Care Recommendations: Oral care BID;Patient independent with oral care Other Recommendations: (n/a)   Follow up Recommendations None      Frequency and Duration (n/a)  (n/a)       Prognosis Prognosis for Safe Diet Advancement: Good Barriers to Reach Goals: (n/a)      Swallow Study   General Date of Onset: 02/21/19 HPI: Pt is a 58 y.o. male with a known history of alcohol abuse, chronic hepatitis C admitted to the hospital secondary to dizziness, difficulty walking and blurred vision in the right eye starting last night.  He is independent at home; stated he has been drinking Ensure "some". Pt is verbally conversive and follows commands.  Type of Study: Bedside Swallow Evaluation Previous Swallow Assessment: none Diet Prior to this Study: Regular;Thin liquids Temperature Spikes Noted: No(wbc not elevated) Respiratory Status: Room air History of Recent Intubation: No Behavior/Cognition: Alert;Cooperative;Pleasant mood Oral Cavity Assessment: Within Functional Limits Oral Care Completed by SLP: Recent completion by staff Oral Cavity - Dentition: Adequate natural dentition(missing few) Vision: Functional for self-feeding(though c/o R eye blurriness) Self-Feeding Abilities: Able to feed self Patient Positioning: Upright in bed(sat up I) Baseline Vocal Quality: Normal Volitional Cough: Strong Volitional Swallow: Able to elicit    Oral/Motor/Sensory Function Overall Oral Motor/Sensory Function: Within functional  limits   Ice Chips Ice chips: Not tested   Thin Liquid Thin Liquid: Within functional  limits Presentation: Self Fed;Straw(10+ sips)    Nectar Thick Nectar Thick Liquid: Not tested   Honey Thick Honey Thick Liquid: Not tested   Puree Puree: Not tested   Solid     Solid: Within functional limits Presentation: Self Fed(5 trials)       Orinda Kenner, MS, CCC-SLP Watson,Katherine 02/22/2019,1:42 PM

## 2019-02-22 NOTE — Progress Notes (Addendum)
OT Screen Note  Patient Details Name: Johnny [name] MRN: [id number] DOB: February 22, 1961   Cancelled Treatment:    Reason Eval/Treat Not Completed: OT screened, no needs identified, will sign off. Consult received, chart reviewed. Upon attempt, pt reporting feeling "mostly" back to baseline independence. Only notable deficit appreciated was R eye adduction limited. Per pt, "doctor told me that it would get better with time." OT encouraged to consider OP OT in the future should his R eye deficits not resolve with time. Briefly educated in turning head to L side to compensate for R eye impairment. Pt denied additional needs. No additional skilled needs identified. Will sign off. Please re-consult if additional needs arise.   Jeni Salles, MPH, MS, OTR/L ascom [phone] 02/22/19, 11:34 AM

## 2019-02-22 NOTE — Discharge Summary (Addendum)
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  1.Diplopia with right eye adduction paralysis- ?TIA  -MRI is negative.  MRI negative as well with no tiny aneurysms noted -Pupil reaction is spared. Concern for 3rd nerve involvement. Given his ataxia/dizziness- cerebellar involvement?  Might need ophthalmology follow-up as outpatient -Chronic small vessel ischemic changes noted on MRI -Normal carotid Dopplers with no hemodynamically significant stenosis -Patient could not wait for the echocardiogram to be done in the hospital and wanted to follow-up as outpatient.  Echo was not done given his COVID test pending -Neurology consult. Neurochecks -PT/OT and speech therapy consults requested-worked well with therapy, no discharge needs -Aspirin and statin. LDL is 83.  Given his hepatitis C, liver cirrhosis and borderline platelet count, not a candidate for dual antiplatelet therapy.  2. Alcohol [name]-counseled. Patient denies any withdrawal symptoms.  3.Cirrhosis withAlcoholismand hepatitis C -prior history of paracentesis. Stable. Continue aldactone  4. Hepatitis C followed with GI in 2018 and finished  treatment with Epclusa  Patient is up and ambulatory.  No therapy needs identified at discharge.  He needs to follow-up for echocardiogram   DISCHARGE CONDITIONS:   Guarded  CONSULTS OBTAINED:   Treatment Team:  Catarina Hartshorn, MD  DRUG ALLERGIES:   No Known Allergies DISCHARGE MEDICATIONS:   Allergies as of 02/22/2019   No Known Allergies     Medication List    TAKE these medications   aspirin 81 MG chewable tablet Chew 1 tablet (81 mg total) by mouth daily.   atorvastatin 40 MG tablet Commonly known as: LIPITOR Take 1 tablet (40 mg total) by mouth daily at 6 PM.   spironolactone 25 MG tablet Commonly known as: ALDACTONE Take 1 tablet (25 mg total) by mouth daily.        DISCHARGE INSTRUCTIONS:   1.  PCP follow-up in 1 to 2 weeks 2.  Neurology follow-up in 2 weeks 3.  Echocardiogram to be done within 1 week  DIET:   Cardiac diet  ACTIVITY:   Activity as tolerated  OXYGEN:   Home Oxygen: No.  Oxygen Delivery: room air  DISCHARGE LOCATION:   home   If you experience worsening of your admission symptoms, develop shortness of breath, life threatening emergency, suicidal or homicidal thoughts you must seek medical attention immediately by calling 911 or calling your MD immediately  if symptoms less severe.  You Must read complete instructions/literature along with all the possible adverse reactions/side effects for all the Medicines you take and that have been prescribed to you. Take any new Medicines after you have completely understood and accpet all the possible adverse reactions/side effects.   Please note  You were cared for by a hospitalist during your hospital  stay. If you have any questions about your discharge medications or the care you received while you were in the hospital after you are discharged, you can call the unit and asked to speak with the hospitalist on call if the hospitalist that took care of you is not available. Once you  are discharged, your primary care physician will handle any further medical issues. Please note that NO REFILLS for any discharge medications will be authorized once you are discharged, as it is imperative that you return to your primary care physician (or establish a relationship with a primary care physician if you do not have one) for your aftercare needs so that they can reassess your need for medications and monitor your lab values.    On the day of Discharge:  VITAL SIGNS:   Blood pressure 132/84, pulse 72, temperature 98.6 F (37 C), temperature source Oral, resp. rate 16, height 5\' 7"  (1.702 m), weight 62.1 kg, SpO2 98 %.  PHYSICAL EXAMINATION:   GENERAL:58 y.o.-year-old patient lying in the bed with no acute distress.  EYES: Pupils equal, round, reactive to light and accommodation. No scleral icterus.Right eye adduction paralyzed. HEENT: Head atraumatic, normocephalic. Oropharynx and nasopharynx clear.  NECK: Supple, no jugular venous distention. No thyroid enlargement, no tenderness.  LUNGS: Normal breath sounds bilaterally, no wheezing, rales,rhonchi or crepitation. No use of accessory muscles of respiration.Decreased bibasilar breath sounds CARDIOVASCULAR: S1, S2 normal. No murmurs, rubs, or gallops.  ABDOMEN: Soft, nontender, nondistended. Bowel sounds present. No organomegaly or mass.  EXTREMITIES: No pedal edema, cyanosis, or clubbing.No tremors noted NEUROLOGIC: Cranial nerves II through XII are intact. Muscle strength 5/5 in all extremities. Sensation intact. Gait not checked.DTR equal on both lower extremities.  PSYCHIATRIC: The patient is alert and oriented x 3.  SKIN: No obvious rash, lesion, or ulcer.   DATA REVIEW:   CBC Recent Labs  Lab 02/22/19 0540  WBC 4.7  HGB 11.2*  HCT 33.6*  PLT 108*    Chemistries  Recent Labs  Lab 02/21/19 0753 02/22/19 0540  NA 137 136  K 4.1 4.4  CL 104 102  CO2 24 24  GLUCOSE 92 95  BUN 17 21*  CREATININE 1.04  1.15  CALCIUM 9.5 9.3  AST 87*  --   ALT 44  --   ALKPHOS 104  --   BILITOT 1.2  --      Microbiology Results  Results for orders placed or performed during the hospital encounter of 10/05/17  Body fluid culture     Status: None   Collection Time: 10/05/17  8:37 AM   Specimen: [facility] Cytology Peritoneal fluid  Result Value Ref Range Status   Specimen Description   Final    PERITONEAL FLUID Performed at [facility], [address] [place], [place] [address]    Special Requests   Final    PERITONEAL FLUID Performed at [facility], [address] [place], [place] [address]    Gram Stain   Final    RARE WBC PRESENT, PREDOMINANTLY MONONUCLEAR NO ORGANISMS SEEN    Culture   Final    No growth aerobically or anaerobically. Performed at [facility], [place] [address], [place], [place] [address]    Report Status 10/08/2017 FINAL  Final    RADIOLOGY:  Mr Angio Head Wo Contrast  Result Date: 02/21/2019 CLINICAL DATA:  Dizziness beginning yesterday.  Visual disturbance. EXAM: MRA HEAD WITHOUT CONTRAST TECHNIQUE: Angiographic images of the Circle of Willis were obtained  using MRA technique without intravenous contrast. COMPARISON:  MRI same day FINDINGS: Both internal carotid arteries are patent through the skull base and siphon regions. The anterior and middle cerebral vessels are patent without proximal stenosis, aneurysm or vascular malformation. Both vertebral arteries are patent to the basilar. Incidental basilar fenestration. No basilar stenosis. Posterior circulation branch vessels are patent without proximal stenosis. IMPRESSION: Negative intracranial MR angiography of the large and medium sized vessels. Electronically Signed   By: Nelson Chimes M.D.   On: 02/21/2019 14:39   Mr Brain Wo Contrast  Result Date: 02/21/2019 CLINICAL DATA:  Dizziness beginning yesterday.  Visual disturbance. EXAM: MRI HEAD WITHOUT CONTRAST TECHNIQUE: Multiplanar, multiecho pulse  sequences of the brain and surrounding structures were obtained without intravenous contrast. COMPARISON:  None. FINDINGS: Brain: Diffusion imaging does not show any acute or subacute infarction. The brainstem shows mild chronic small-vessel change of the pons. No cerebellar insult. Cerebral hemispheres show mild to moderate chronic small-vessel ischemic changes of the deep and subcortical white matter. No cortical or large vessel territory infarction. No mass lesion, hemorrhage, hydrocephalus or extra-axial collection. Vascular: Major vessels at the base of the brain show flow. Skull and upper cervical spine: Negative Sinuses/Orbits: Clear/normal Other: None IMPRESSION: No acute finding. Chronic small-vessel ischemic changes of the pons and cerebral hemispheric white matter. No specific abnormality seen to explain the clinical presentation. Electronically Signed   By: Nelson Chimes M.D.   On: 02/21/2019 11:53   US Carotid Bilateral  Result Date: 02/22/2019 CLINICAL DATA:  Syncope, stroke EXAM: BILATERAL CAROTID DUPLEX ULTRASOUND TECHNIQUE: Pearline Cables scale imaging, color Doppler and duplex ultrasound were performed of bilateral carotid and vertebral arteries in the neck. COMPARISON:  None. FINDINGS: Criteria: Quantification of carotid stenosis is based on velocity parameters that correlate the residual internal carotid diameter with NASCET-based stenosis levels, using the diameter of the distal internal carotid lumen as the denominator for stenosis measurement. The following velocity measurements were obtained: RIGHT ICA: 62/21 cm/sec CCA: 34/28 cm/sec SYSTOLIC ICA/CCA RATIO:  0.7 ECA: 54 cm/sec LEFT ICA: 57/18 cm/sec CCA: 76/81 cm/sec SYSTOLIC ICA/CCA RATIO:  0.9 ECA: 55 cm/sec RIGHT CAROTID ARTERY: Minimal plaque formation. No hemodynamically significant right ICA stenosis, velocity elevation, or turbulent flow. Degree of narrowing less than 50%. RIGHT VERTEBRAL ARTERY:  Antegrade LEFT CAROTID ARTERY: Similar  scattered minimal plaque formation. No hemodynamically significant left ICA stenosis, velocity elevation, or turbulent flow. LEFT VERTEBRAL ARTERY:  Antegrade IMPRESSION: Minimal carotid atherosclerosis. No hemodynamically significant ICA stenosis. Degree of narrowing less than 50% bilaterally by ultrasound criteria. Patent antegrade vertebral flow bilaterally Electronically Signed   By: Jerilynn Mages.  Shick M.D.   On: 02/22/2019 10:32     Management plans discussed with the patient, family and they are in agreement.  CODE STATUS:     Code Status Orders  (From admission, onward)         Start     Ordered   02/21/19 1610  Full code  Continuous     02/21/19 1609        Code Status History    This patient has a current code status but no historical code status.   Advance Care Planning Activity      TOTAL TIME TAKING CARE OF THIS PATIENT: 38 minutes.    Gladstone Lighter M.D on 02/22/2019 at 11:19 AM  Between 7am to 6pm - Pager - [phone]  After 6pm go to [email or web address] - Proofreader  [employer]  [id number]  CC: Primary care physician; Patient, No Pcp Per   Note: This dictation was prepared with Dragon dictation along with smaller phrase technology. Any transcriptional errors that result from this process are unintentional.

## 2019-02-22 NOTE — Progress Notes (Signed)
     Johnny [name] was admitted to the [facility] on 02/21/2019 for an acute medical condition and is being Discharged on  02/22/2019 . He  will need another 3 days for recovery and so advised to stay away from work until then. So please excuse him  from work for the above Days. Should be able to return to work  without any restrictions from 02/25/19.  Call Gladstone Lighter  MD, [facility] at  [phone] with questions.  Gladstone Lighter M.D on 02/22/2019,at 7:04 PM  [facility] [address] [place] [address]

## 2019-02-22 NOTE — Plan of Care (Signed)
  Problem: Activity: Goal: Risk for activity intolerance will decrease 02/22/2019 0609 by Herbie Baltimore, RN Outcome: Progressing 02/22/2019 0608 by Herbie Baltimore, RN Outcome: Progressing   Problem: Pain Managment: Goal: General experience of comfort will improve 02/22/2019 0609 by Herbie Baltimore, RN Outcome: Progressing 02/22/2019 0608 by Herbie Baltimore, RN Outcome: Progressing   Problem: Nutrition: Goal: Adequate nutrition will be maintained 02/22/2019 0609 by Herbie Baltimore, RN Outcome: Progressing 02/22/2019 0608 by Herbie Baltimore, RN Outcome: Progressing   Problem: Safety: Goal: Ability to remain free from injury will improve 02/22/2019 0609 by Herbie Baltimore, RN Outcome: Progressing 02/22/2019 0608 by Herbie Baltimore, RN Outcome: Progressing   Problem: Education: Goal: Knowledge of disease or condition will improve 02/22/2019 0609 by Herbie Baltimore, RN Outcome: Progressing 02/22/2019 0608 by Herbie Baltimore, RN Outcome: Progressing Goal: Knowledge of secondary prevention will improve 02/22/2019 0609 by Herbie Baltimore, RN Outcome: Progressing 02/22/2019 0608 by Herbie Baltimore, RN Outcome: Progressing

## 2019-02-22 NOTE — Evaluation (Signed)
Physical Therapy Evaluation Patient Details Name: Johnny [name] MRN: [id number] DOB: 03/06/1961 Today's Date: 02/22/2019   History of Present Illness  58 y.o. male with a known history of alcohol abuse, chronic hepatitis C finished treatment hospital hospital secondary to dizziness, difficulty walking and blurred vision in the right eye.  MRI (-) for acute infarct  Clinical Impression  Pt did well with PT exam, eager to get up to sitting and go for a walk.  He was able to confidently rise to standing (did initially lean back of legs on bed, but quickly able to shift weight forward and maintain balance w/o issue) and though he could not do SLS (either side) w/o HHA he displayed functional balance with static and dynamic testing and activities.  He was able to ambulate multiple loops in the room w/o AD or assist, though he did have a limp that he reports is his baseline.  Pt with no fatigue during the ambulation effort and overall showed and reported confidence on being able to go home w/o assist or PT f/u.        Follow Up Recommendations No PT follow up    Equipment Recommendations  None recommended by PT    Recommendations for Other Services       Precautions / Restrictions Precautions Precautions: Fall Restrictions Weight Bearing Restrictions: No      Mobility  Bed Mobility Overal bed mobility: Independent             General bed mobility comments: Pt easily gets to sitting EOB w/o assist  Transfers Overall transfer level: Independent Equipment used: None             General transfer comment: Pt able to rise to standing and maintain balance w/o assist  Ambulation/Gait Ambulation/Gait assistance: Supervision Gait Distance (Feet): 75 Feet Assistive device: None       General Gait Details: Pt with slight limp that he reports is baseline, "That's just the way I walk."  He did not have any LOBs or overt safety issues, no fatigue with O2 in the high 90s pre and  post ambulation  Stairs            Wheelchair Mobility    Modified Rankin (Stroke Patients Only)       Balance Overall balance assessment: Modified Independent(able to maintain eyes closed with external perturbations)                                           Pertinent Vitals/Pain Pain Assessment: No/denies pain    Home Living Family/patient expects to be discharged to:: Private residence Living Arrangements: Alone                    Prior Function Level of Independence: Independent         Comments: Pt is independent, does yard work, shopping, etc w/o assist or assist     Hand Dominance        Extremity/Trunk Assessment   Upper Extremity Assessment Upper Extremity Assessment: Overall WFL for tasks assessed(no focal weakness or asymmtery)    Lower Extremity Assessment Lower Extremity Assessment: Overall WFL for tasks assessed(no focal weakness or asymmtery)       Communication   Communication: No difficulties  Cognition Arousal/Alertness: Awake/alert Behavior During Therapy: WFL for tasks assessed/performed Overall Cognitive Status: Within Functional Limits for tasks assessed  General Comments General comments (skin integrity, edema, etc.): Pt reports continued blurry vision that is improved since admission.  He does not have dramatic eye droop or facial asymmetry     Exercises     Assessment/Plan    PT Assessment Patent does not need any further PT services  PT Problem List Decreased safety awareness;Decreased balance       PT Treatment Interventions      PT Goals (Current goals can be found in the Care Plan section)  Acute Rehab PT Goals Patient Stated Goal: Go home PT Goal Formulation: All assessment and education complete, DC therapy    Frequency     Barriers to discharge        Co-evaluation               AM-PAC PT "6 Clicks" Mobility   Outcome Measure Help needed turning from your back to your side while in a flat bed without using bedrails?: None Help needed moving from lying on your back to sitting on the side of a flat bed without using bedrails?: None Help needed moving to and from a bed to a chair (including a wheelchair)?: None Help needed standing up from a chair using your arms (e.g., wheelchair or bedside chair)?: None Help needed to walk in hospital room?: None Help needed climbing 3-5 steps with a railing? : None 6 Click Score: 24    End of Session Equipment Utilized During Treatment: Gait belt Activity Tolerance: Patient tolerated treatment well Patient left: with bed alarm set;with call bell/phone within reach   PT Visit Diagnosis: Difficulty in walking, not elsewhere classified (R26.2)    Time: 0086-7619 PT Time Calculation (min) (ACUTE ONLY): 25 min   Charges:   PT Evaluation $PT Eval Low Complexity: 1 Low          Kreg Shropshire, DPT 02/22/2019, 10:48 AM

## 2019-02-25 LAB — HIV ANTIBODY (ROUTINE TESTING W REFLEX): HIV Screen 4th Generation wRfx: NONREACTIVE

## 2019-02-27 LAB — VITAMIN B1: Vitamin B1 (Thiamine): 93.1 nmol/L (ref 66.5–200.0)

## 2019-02-27 LAB — NOVEL CORONAVIRUS, NAA (HOSP ORDER, SEND-OUT TO REF LAB; TAT 18-24 HRS): SARS-CoV-2, NAA: NOT DETECTED

## 2020-02-25 ENCOUNTER — Other Ambulatory Visit: Payer: Self-pay | Admitting: Family Medicine

## 2020-02-25 DIAGNOSIS — K7030 Alcoholic cirrhosis of liver without ascites: Secondary | ICD-10-CM

## 2020-03-02 ENCOUNTER — Other Ambulatory Visit: Payer: Self-pay | Admitting: Family Medicine

## 2020-03-02 DIAGNOSIS — M545 Low back pain, unspecified: Secondary | ICD-10-CM

## 2020-03-03 ENCOUNTER — Ambulatory Visit
Admission: RE | Admit: 2020-03-03 | Discharge: 2020-03-03 | Disposition: A | Discharge status: Home / Self Care | Payer: BC Managed Care – PPO | Source: Home / Self Care | Attending: Family Medicine | Admitting: Family Medicine

## 2020-03-03 ENCOUNTER — Ambulatory Visit
Admission: RE | Admit: 2020-03-03 | Discharge: 2020-03-03 | Disposition: A | Discharge status: Home / Self Care | Payer: BC Managed Care – PPO | Source: Ambulatory Visit | Attending: Family Medicine | Admitting: Family Medicine

## 2020-03-03 ENCOUNTER — Ambulatory Visit: Payer: Self-pay

## 2020-03-03 ENCOUNTER — Other Ambulatory Visit: Payer: Self-pay | Admitting: Family Medicine

## 2020-03-03 ENCOUNTER — Other Ambulatory Visit: Payer: Self-pay

## 2020-03-03 DIAGNOSIS — K7030 Alcoholic cirrhosis of liver without ascites: Secondary | ICD-10-CM | POA: Diagnosis not present

## 2020-03-03 DIAGNOSIS — M545 Low back pain, unspecified: Secondary | ICD-10-CM

## 2020-03-08 ENCOUNTER — Ambulatory Visit: Payer: BC Managed Care – PPO

## 2020-03-11 ENCOUNTER — Other Ambulatory Visit: Payer: Self-pay | Admitting: Family Medicine

## 2020-03-11 ENCOUNTER — Other Ambulatory Visit (HOSPITAL_COMMUNITY): Payer: Self-pay | Admitting: Family Medicine

## 2020-03-11 DIAGNOSIS — R160 Hepatomegaly, not elsewhere classified: Secondary | ICD-10-CM

## 2020-03-15 ENCOUNTER — Other Ambulatory Visit: Payer: Self-pay

## 2020-04-27 ENCOUNTER — Other Ambulatory Visit: Payer: Self-pay

## 2020-04-28 ENCOUNTER — Ambulatory Visit: Payer: BC Managed Care – PPO | Admitting: Gastroenterology

## 2020-05-17 ENCOUNTER — Other Ambulatory Visit: Payer: Self-pay | Admitting: Gastroenterology

## 2020-05-17 DIAGNOSIS — K7031 Alcoholic cirrhosis of liver with ascites: Secondary | ICD-10-CM

## 2020-05-18 ENCOUNTER — Other Ambulatory Visit: Payer: Self-pay

## 2020-05-18 ENCOUNTER — Ambulatory Visit
Admission: RE | Admit: 2020-05-18 | Discharge: 2020-05-18 | Disposition: A | Discharge status: Home / Self Care | Payer: BC Managed Care – PPO | Source: Ambulatory Visit | Attending: Gastroenterology | Admitting: Gastroenterology

## 2020-05-18 DIAGNOSIS — K7031 Alcoholic cirrhosis of liver with ascites: Secondary | ICD-10-CM

## 2020-05-18 LAB — BODY FLUID CELL COUNT WITH DIFFERENTIAL
Eos, Fluid: 0 %
Lymphs, Fluid: 22 %
Monocyte-Macrophage-Serous Fluid: 78 %
Neutrophil Count, Fluid: 0 %
Total Nucleated Cell Count, Fluid: 283 cu mm

## 2020-05-18 NOTE — Procedures (Signed)
Ultrasound-guided diagnostic and therapeutic paracentesis performed yielding 3.8 liters of yellow fluid. No immediate complications. EBL none. A portion of the fluid was sent to the lab for preordered studies.

## 2020-05-19 LAB — PROTEIN, BODY FLUID (OTHER): Total Protein, Body Fluid Other: 2.2 g/dL

## 2020-05-19 LAB — PROTEIN, PLEURAL OR PERITONEAL FLUID: Total protein, fluid: <3 g/dL

## 2020-05-19 LAB — ALBUMIN, PLEURAL OR PERITONEAL FLUID: Albumin, Fluid: 1.3 g/dL

## 2020-05-20 LAB — CYTOLOGY - NON PAP

## 2020-05-22 LAB — BODY FLUID CULTURE
Culture: NO GROWTH
Gram Stain: NONE SEEN

## 2020-07-28 ENCOUNTER — Ambulatory Visit: Payer: BC Managed Care – PPO | Admitting: Gastroenterology

## 2020-07-28 ENCOUNTER — Encounter: Payer: Self-pay | Admitting: Gastroenterology

## 2020-11-09 NOTE — Telephone Encounter (Signed)
To close the telephone encounter

## 2021-07-06 ENCOUNTER — Other Ambulatory Visit: Payer: Self-pay | Admitting: Gastroenterology

## 2021-07-06 DIAGNOSIS — K7031 Alcoholic cirrhosis of liver with ascites: Secondary | ICD-10-CM

## 2021-07-07 ENCOUNTER — Ambulatory Visit
Admission: RE | Admit: 2021-07-07 | Discharge: 2021-07-07 | Disposition: A | Discharge status: Home / Self Care | Payer: Medicaid Other | Source: Ambulatory Visit | Attending: Gastroenterology | Admitting: Gastroenterology

## 2021-07-07 ENCOUNTER — Other Ambulatory Visit: Payer: Self-pay

## 2021-07-07 DIAGNOSIS — K7031 Alcoholic cirrhosis of liver with ascites: Secondary | ICD-10-CM | POA: Insufficient documentation

## 2021-07-07 LAB — ALBUMIN, PLEURAL OR PERITONEAL FLUID: Albumin, Fluid: <1.5 g/dL

## 2021-07-07 LAB — BODY FLUID CELL COUNT WITH DIFFERENTIAL
Eos, Fluid: 1 %
Lymphs, Fluid: 11 %
Monocyte-Macrophage-Serous Fluid: 82 %
Neutrophil Count, Fluid: 6 %
Other Cells, Fluid: 0 %
Total Nucleated Cell Count, Fluid: 238 cu mm

## 2021-07-07 LAB — PROTEIN, PLEURAL OR PERITONEAL FLUID: Total protein, fluid: <3 g/dL

## 2021-07-07 NOTE — Procedures (Signed)
PROCEDURE SUMMARY:  Successful US guided paracentesis from LLQ.  Yielded 3.6 L of clear yellow fluid.  No immediate complications.  Pt tolerated well.   Specimen was sent for labs.  EBL < 28mL  Rockney Ghee 07/07/2021 3:55 PM

## 2021-07-09 LAB — PROTEIN, BODY FLUID (OTHER): Total Protein, Body Fluid Other: 2.7 g/dL

## 2021-07-11 LAB — CYTOLOGY - NON PAP

## 2021-07-12 LAB — AEROBIC/ANAEROBIC CULTURE W GRAM STAIN (SURGICAL/DEEP WOUND): Culture: NO GROWTH

## 2021-08-11 ENCOUNTER — Emergency Department: Payer: Medicaid Other

## 2021-08-11 ENCOUNTER — Inpatient Hospital Stay
Admission: EM | Admit: 2021-08-11 | Discharge: 2021-08-15 | DRG: 435 | Disposition: A | Discharge status: Hospice | Payer: Medicaid Other | Attending: Internal Medicine | Admitting: Internal Medicine

## 2021-08-11 ENCOUNTER — Other Ambulatory Visit: Payer: Self-pay

## 2021-08-11 DIAGNOSIS — Z9114 Patient's other noncompliance with medication regimen: Secondary | ICD-10-CM | POA: Diagnosis not present

## 2021-08-11 DIAGNOSIS — R180 Malignant ascites: Secondary | ICD-10-CM | POA: Diagnosis present

## 2021-08-11 DIAGNOSIS — K7030 Alcoholic cirrhosis of liver without ascites: Secondary | ICD-10-CM | POA: Diagnosis present

## 2021-08-11 DIAGNOSIS — K219 Gastro-esophageal reflux disease without esophagitis: Secondary | ICD-10-CM | POA: Diagnosis present

## 2021-08-11 DIAGNOSIS — F1020 Alcohol dependence, uncomplicated: Secondary | ICD-10-CM | POA: Diagnosis present

## 2021-08-11 DIAGNOSIS — N179 Acute kidney failure, unspecified: Secondary | ICD-10-CM | POA: Diagnosis present

## 2021-08-11 DIAGNOSIS — Z79899 Other long term (current) drug therapy: Secondary | ICD-10-CM

## 2021-08-11 DIAGNOSIS — Z20822 Contact with and (suspected) exposure to covid-19: Secondary | ICD-10-CM | POA: Diagnosis present

## 2021-08-11 DIAGNOSIS — R140 Abdominal distension (gaseous): Secondary | ICD-10-CM

## 2021-08-11 DIAGNOSIS — D649 Anemia, unspecified: Secondary | ICD-10-CM | POA: Diagnosis present

## 2021-08-11 DIAGNOSIS — R5381 Other malaise: Secondary | ICD-10-CM | POA: Diagnosis present

## 2021-08-11 DIAGNOSIS — K767 Hepatorenal syndrome: Secondary | ICD-10-CM | POA: Diagnosis present

## 2021-08-11 DIAGNOSIS — E8721 Acute metabolic acidosis: Secondary | ICD-10-CM | POA: Diagnosis present

## 2021-08-11 DIAGNOSIS — C220 Liver cell carcinoma: Principal | ICD-10-CM | POA: Diagnosis present

## 2021-08-11 DIAGNOSIS — R918 Other nonspecific abnormal finding of lung field: Secondary | ICD-10-CM

## 2021-08-11 DIAGNOSIS — D72829 Elevated white blood cell count, unspecified: Secondary | ICD-10-CM | POA: Diagnosis not present

## 2021-08-11 DIAGNOSIS — B182 Chronic viral hepatitis C: Secondary | ICD-10-CM | POA: Diagnosis present

## 2021-08-11 DIAGNOSIS — Z66 Do not resuscitate: Secondary | ICD-10-CM | POA: Diagnosis present

## 2021-08-11 DIAGNOSIS — K7040 Alcoholic hepatic failure without coma: Secondary | ICD-10-CM | POA: Diagnosis present

## 2021-08-11 DIAGNOSIS — K652 Spontaneous bacterial peritonitis: Secondary | ICD-10-CM | POA: Diagnosis present

## 2021-08-11 DIAGNOSIS — Z7982 Long term (current) use of aspirin: Secondary | ICD-10-CM | POA: Diagnosis not present

## 2021-08-11 DIAGNOSIS — E871 Hypo-osmolality and hyponatremia: Secondary | ICD-10-CM | POA: Diagnosis present

## 2021-08-11 DIAGNOSIS — R188 Other ascites: Secondary | ICD-10-CM | POA: Diagnosis present

## 2021-08-11 DIAGNOSIS — R748 Abnormal levels of other serum enzymes: Secondary | ICD-10-CM | POA: Diagnosis not present

## 2021-08-11 DIAGNOSIS — K8020 Calculus of gallbladder without cholecystitis without obstruction: Secondary | ICD-10-CM | POA: Diagnosis present

## 2021-08-11 DIAGNOSIS — C779 Secondary and unspecified malignant neoplasm of lymph node, unspecified: Secondary | ICD-10-CM | POA: Diagnosis present

## 2021-08-11 DIAGNOSIS — I10 Essential (primary) hypertension: Secondary | ICD-10-CM | POA: Diagnosis present

## 2021-08-11 DIAGNOSIS — K7031 Alcoholic cirrhosis of liver with ascites: Secondary | ICD-10-CM | POA: Diagnosis not present

## 2021-08-11 HISTORY — DX: Malignant (primary) neoplasm, unspecified: C80.1

## 2021-08-11 HISTORY — DX: Other ascites: R18.8

## 2021-08-11 LAB — COMPREHENSIVE METABOLIC PANEL
ALT: 88 U/L — ABNORMAL HIGH (ref 0–44)
AST: 270 U/L — ABNORMAL HIGH (ref 15–41)
Albumin: 2.8 g/dL — ABNORMAL LOW (ref 3.5–5.0)
Alkaline Phosphatase: 796 U/L — ABNORMAL HIGH (ref 38–126)
Anion gap: 8 (ref 5–15)
BUN: 44 mg/dL — ABNORMAL HIGH (ref 8–23)
CO2: 18 mmol/L — ABNORMAL LOW (ref 22–32)
Calcium: 9.1 mg/dL (ref 8.9–10.3)
Chloride: 107 mmol/L (ref 98–111)
Creatinine, Ser: 1.82 mg/dL — ABNORMAL HIGH (ref 0.61–1.24)
GFR, Estimated: 41 mL/min — ABNORMAL LOW (ref >60)
Glucose, Bld: 98 mg/dL (ref 70–99)
Potassium: 4.7 mmol/L (ref 3.5–5.1)
Sodium: 133 mmol/L — ABNORMAL LOW (ref 135–145)
Total Bilirubin: 3.1 mg/dL — ABNORMAL HIGH (ref 0.3–1.2)
Total Protein: 7.1 g/dL (ref 6.5–8.1)

## 2021-08-11 LAB — CBC
HCT: 37.5 % — ABNORMAL LOW (ref 39.0–52.0)
Hemoglobin: 11.9 g/dL — ABNORMAL LOW (ref 13.0–17.0)
MCH: 27.4 pg (ref 26.0–34.0)
MCHC: 31.7 g/dL (ref 30.0–36.0)
MCV: 86.4 fL (ref 80.0–100.0)
Platelets: 341 10*3/uL (ref 150–400)
RBC: 4.34 MIL/uL (ref 4.22–5.81)
RDW: 18.6 % — ABNORMAL HIGH (ref 11.5–15.5)
WBC: 27 10*3/uL — ABNORMAL HIGH (ref 4.0–10.5)
nRBC: 0 % (ref 0.0–0.2)

## 2021-08-11 LAB — URINALYSIS, MICROSCOPIC (REFLEX)

## 2021-08-11 LAB — URINALYSIS, ROUTINE W REFLEX MICROSCOPIC
Glucose, UA: NEGATIVE mg/dL
Hgb urine dipstick: NEGATIVE
Ketones, ur: 15 mg/dL — AB
Leukocytes,Ua: NEGATIVE
Nitrite: NEGATIVE
Protein, ur: 30 mg/dL — AB
Specific Gravity, Urine: 1.01 (ref 1.005–1.030)
pH: 5 (ref 5.0–8.0)

## 2021-08-11 LAB — PROTIME-INR
INR: 1.1 (ref 0.8–1.2)
Prothrombin Time: 14 seconds (ref 11.4–15.2)

## 2021-08-11 LAB — RESP PANEL BY RT-PCR (FLU A&B, COVID) ARPGX2
Influenza A by PCR: NEGATIVE
Influenza B by PCR: NEGATIVE
SARS Coronavirus 2 by RT PCR: NEGATIVE

## 2021-08-11 LAB — LIPASE, BLOOD: Lipase: 52 U/L — ABNORMAL HIGH (ref 11–51)

## 2021-08-11 MED ORDER — SODIUM CHLORIDE 0.9 % IV SOLN
1.0000 g | Freq: Once | INTRAVENOUS | Status: AC
  Administered 2021-08-11: 10:00:00 1 g via INTRAVENOUS
  Filled 2021-08-11: qty 10

## 2021-08-11 MED ORDER — ALBUMIN HUMAN 25 % IV SOLN
50.0000 g | Freq: Once | INTRAVENOUS | Status: AC
  Administered 2021-08-12: 08:00:00 50 g via INTRAVENOUS
  Filled 2021-08-11: qty 200

## 2021-08-11 MED ORDER — SODIUM CHLORIDE 0.9 % IV BOLUS
1000.0000 mL | Freq: Once | INTRAVENOUS | Status: AC
  Administered 2021-08-11: 10:00:00 1000 mL via INTRAVENOUS

## 2021-08-11 MED ORDER — OXYCODONE HCL 5 MG PO TABS
5.0000 mg | ORAL_TABLET | ORAL | Status: DC | PRN
  Administered 2021-08-11 – 2021-08-15 (×9): 5 mg via ORAL
  Filled 2021-08-11 (×9): qty 1

## 2021-08-11 MED ORDER — KETOROLAC TROMETHAMINE 30 MG/ML IJ SOLN
INTRAMUSCULAR | Status: AC
  Filled 2021-08-11: qty 1

## 2021-08-11 MED ORDER — KETOROLAC TROMETHAMINE 15 MG/ML IJ SOLN
15.0000 mg | Freq: Once | INTRAMUSCULAR | Status: AC
  Administered 2021-08-11: 15:00:00 15 mg via INTRAVENOUS
  Filled 2021-08-11: qty 1

## 2021-08-11 MED ORDER — MORPHINE SULFATE (PF) 4 MG/ML IV SOLN
4.0000 mg | Freq: Once | INTRAVENOUS | Status: AC
  Administered 2021-08-11: 11:00:00 4 mg via INTRAVENOUS
  Filled 2021-08-11: qty 1

## 2021-08-11 MED ORDER — SODIUM CHLORIDE 0.9 % IV SOLN
1.0000 g | Freq: Once | INTRAVENOUS | Status: AC
  Administered 2021-08-11: 17:00:00 1 g via INTRAVENOUS
  Filled 2021-08-11: qty 10

## 2021-08-11 MED ORDER — SODIUM CHLORIDE 0.9 % IV SOLN
2.0000 g | INTRAVENOUS | Status: DC
  Administered 2021-08-12 – 2021-08-15 (×4): 2 g via INTRAVENOUS
  Filled 2021-08-11 (×2): qty 20
  Filled 2021-08-11 (×2): qty 2

## 2021-08-11 MED ORDER — KETOROLAC TROMETHAMINE 15 MG/ML IJ SOLN: 15.0000 mg | Freq: Once | INTRAMUSCULAR | Status: DC

## 2021-08-11 MED ORDER — IOHEXOL 300 MG/ML  SOLN
80.0000 mL | Freq: Once | INTRAMUSCULAR | Status: AC | PRN
  Administered 2021-08-11: 11:00:00 80 mL via INTRAVENOUS

## 2021-08-11 NOTE — ED Notes (Signed)
First nurse note   family states he has a "lot of fluid" on abd  was told to come to ED by PCP

## 2021-08-11 NOTE — ED Notes (Signed)
Attempted for blue tube via butterfly at L fa. Blood pulls back into line until it meets adapter then won't pull into tube.

## 2021-08-11 NOTE — ED Triage Notes (Signed)
Pt c/o abd distention, states he has a hx of ascites and has had to have fluid drained off in the past

## 2021-08-11 NOTE — ED Notes (Addendum)
Attempted for blue tube at R ac via butterfly. Will have phlebotomy assist in collecting. Pt agreeable. Pt denies major pain currently; offered pain meds if needed and pt declined.

## 2021-08-11 NOTE — ED Notes (Signed)
Pt given 3 sips of water with verbal okay from Attending O. Adefeso. States pt to go NPO at midnight. Pt notified.

## 2021-08-11 NOTE — ED Notes (Signed)
Pt taken to CT at this time.

## 2021-08-11 NOTE — H&P (Signed)
History and Physical  Johnny [name] JXB:[id number] DOB: 1961-07-10 DOA: 08/11/2021  Referring physician: Vladimir Crofts, MD PCP: Theotis Burrow, MD  Patient coming from: Home  Chief Complaint: Abdominal distention  HPI: Johnny [name] is a 60 y.o. male with medical history significant for GERD, HTN, alcohol use with alcoholic cirrhosis, metastatic [place] who presents to the emergency department due to several days of onset of worsening abdominal distention, he complained pain in right and left upper quadrants.  He reported poor oral intake, but denies fever, chills, nausea, vomiting or diarrhea.  Patient states that he had paracentesis done about a week ago and that the fluid buildup within the last few days. Patient was recently admitted to [facility] on 12/12-12/15 due to altered mental status during which he was  diagnosed to have sepsis secondary to pneumonia and was treated with Vanco and Zosyn.  ED Course:  In the emergency department, blood pressure was soft at 91/63, but other vital signs were within normal range.  Work-up in the ED showed leukocytosis, normocytic anemia, mild hyponatremia, chloride 107, bicarb 18, BUN/creatinine 44/1.82 (last creatinine on record was 2 years ago and it was 1.0-1.2), and elevated liver enzymes.  Influenza A, B, SARS coronavirus was negative. CT abdomen and pelvis with contrast showed Coarse, nodular, cirrhotic morphology of the liver. Large, heterogeneously hypoenhancing lesion occupying the majority of the left lobe of the liver, difficult to accurately measure due to  indistinct margins although at least 16.2 x 10.1 cm, containing a coil or clip. Findings are consistent with reported hepatocellular carcinoma, presumably post treatment.  Review of Systems: Constitutional: Negative for chills and fever.  HENT: Negative for ear pain and sore throat.   Eyes: Negative for pain and visual disturbance.  Respiratory: Negative for cough, chest  tightness and shortness of breath.   Cardiovascular: Negative for chest pain and palpitations.  Gastrointestinal: Positive for abdominal distention, upper abdominal pain.  Negative for vomiting.  Endocrine: Negative for polyphagia and polyuria.  Genitourinary: Negative for decreased urine volume, dysuria, enuresis Musculoskeletal: Negative for arthralgias and back pain.  Skin: Negative for color change and rash.  Allergic/Immunologic: Negative for immunocompromised state.  Neurological: Negative for tremors, syncope, speech difficulty  Hematological: Does not bruise/bleed easily.  All other systems reviewed and are negative   Past Medical History:  Diagnosis Date   Alcoholism ([place])    Ascites    Cancer ([place])    Hep C w/ coma, chronic    Past Surgical History:  Procedure Laterality Date   COLONOSCOPY WITH PROPOFOL N/A 02/16/2017   Procedure: COLONOSCOPY WITH PROPOFOL;  Surgeon: Lollie Sails, MD;  Location: [facility] ENDOSCOPY;  Service: Endoscopy;  Laterality: N/A;   ESOPHAGOGASTRODUODENOSCOPY (EGD) WITH PROPOFOL N/A 02/16/2017   Procedure: ESOPHAGOGASTRODUODENOSCOPY (EGD) WITH PROPOFOL;  Surgeon: Lollie Sails, MD;  Location[facility] ENDOSCOPY;  Service: Endoscopy;  Laterality: N/A;   PARACENTESIS      Social History:  reports that he has never smoked. He has never used smokeless tobacco. He reports that he does not currently use alcohol. He reports that he does not use drugs.   No Known Allergies  No family history on file.   Prior to Admission medications   Medication Sig Start Date End Date Taking? Authorizing Provider  LORazepam (ATIVAN) 0.5 MG tablet Take by mouth. 08/04/21 08/14/21 Yes [provider]  Multiple Vitamin (QUINTABS) TABS Take 1 tablet by mouth daily. 08/05/21 08/05/22 Yes [provider]  amLODipine (NORVASC) 10 MG tablet Take 10  mg by mouth daily. 04/22/20   [provider]  aspirin 81 MG chewable tablet Chew 1 tablet (81 mg  total) by mouth daily. 02/22/19   Gladstone Lighter, MD  atorvastatin (LIPITOR) 40 MG tablet Take 1 tablet (40 mg total) by mouth daily at 6 PM. 02/22/19   Gladstone Lighter, MD  cyclobenzaprine (FLEXERIL) 10 MG tablet Take 10 mg by mouth 3 (three) times daily. 03/23/20   [provider]  Ferrous Sulfate (IRON) 325 (65 Fe) MG TABS Take 1 tablet by mouth 2 (two) times daily. 02/27/20   [provider]  folic acid (FOLVITE) 1 MG tablet Take 1 mg by mouth daily. 04/22/20   [provider]  gabapentin (NEURONTIN) 300 MG capsule Take 600 mg by mouth 3 (three) times daily. 04/22/20   [provider]  oxyCODONE (OXY IR/ROXICODONE) 5 MG immediate release tablet Take 5 mg by mouth every 4 (four) hours as needed. 07/26/21   [provider]  pantoprazole (PROTONIX) 40 MG tablet Take 40 mg by mouth 2 (two) times daily. 07/06/21   [provider]  propranolol (INDERAL) 20 MG tablet SMARTSIG:1 Tablet(s) By Mouth Every 12 Hours 04/22/20   [provider]  spironolactone (ALDACTONE) 25 MG tablet Take 1 tablet (25 mg total) by mouth daily. 02/22/19   Gladstone Lighter, MD  thiamine 100 MG tablet Take 100 mg by mouth daily. 04/22/20   [provider]  traMADol (ULTRAM) 50 MG tablet Take 50 mg by mouth every 6 (six) hours as needed. 03/19/20   [provider]  VOLTAREN 1 % GEL Apply 1 g topically 3 (three) times daily. 02/17/20   [provider]  XIFAXAN 550 MG TABS tablet Take 550 mg by mouth 2 (two) times daily. 04/22/20   [provider]    Physical Exam: BP 91/63    Pulse 63    Temp (!) 97.5 F (36.4 C)    Resp 17    Ht 5\' 7"  (1.702 m)    Wt 62.1 kg    SpO2 100%    BMI 21.46 kg/m   General: 60 y.o. year-old male well developed well nourished in no acute distress.  Alert and oriented x3. HEENT: NCAT, EOMI Neck: Supple, trachea medial Cardiovascular: Regular rate and rhythm with no rubs or gallops.  No thyromegaly or JVD noted.  No  lower extremity edema. 2/4 pulses in all 4 extremities. Respiratory: Clear to auscultation with no wheezes or rales. Good inspiratory effort. Abdomen: Soft, tender to palpation of RUQ and LUQ.  Distended with no guarding.  Normal bowel sounds x4 quadrants. Muskuloskeletal: No cyanosis, clubbing or edema noted bilaterally Neuro: CN II-XII intact, strength 5/5 x 4, sensation, reflexes intact Skin: No ulcerative lesions noted or rashes Psychiatry: Judgement and insight appear normal. Mood is appropriate for condition and setting          Labs on Admission:  Basic Metabolic Panel: Recent Labs  Lab 08/11/21 0747  NA 133*  K 4.7  CL 107  CO2 18*  GLUCOSE 98  BUN 44*  CREATININE 1.82*  CALCIUM 9.1   Liver Function Tests: Recent Labs  Lab 08/11/21 0747  AST 270*  ALT 88*  ALKPHOS 796*  BILITOT 3.1*  PROT 7.1  ALBUMIN 2.8*   Recent Labs  Lab 08/11/21 0747  LIPASE 52*   No results for input(s): AMMONIA in the last 168 hours. CBC: Recent Labs  Lab 08/11/21 0747  WBC 27.0*  HGB 11.9*  HCT 37.5*  MCV 86.4  PLT 341   Cardiac Enzymes: No results for input(s): CKTOTAL, CKMB, CKMBINDEX, TROPONINI in the last 168 hours.  BNP (last 3 results) No results for input(s): BNP in the last 8760 hours.  ProBNP (last 3 results) No results for input(s): PROBNP in the last 8760 hours.  CBG: No results for input(s): GLUCAP in the last 168 hours.  Radiological Exams on Admission: CT ABDOMEN PELVIS W CONTRAST  Result Date: 08/11/2021 CLINICAL DATA:  Cirrhotic, HCC, acute abdominal pain and distension, evaluate ascites, biliary obstruction, HCC metastases EXAM: CT ABDOMEN AND PELVIS WITH CONTRAST TECHNIQUE: Multidetector CT imaging of the abdomen and pelvis was performed using the standard protocol following bolus administration of intravenous contrast. CONTRAST:  18mL OMNIPAQUE IOHEXOL 300 MG/ML  SOLN COMPARISON:  None. FINDINGS: Lower chest: Trace right pleural effusion. Bandlike  scarring and or atelectasis of the bilateral lung bases. Multiple small bilateral pulmonary nodules measuring up to 0.5 cm (series 4, image 6). Coronary artery calcifications. Hepatobiliary: Coarse, nodular, cirrhotic morphology of the liver. Large, heterogeneously hypoenhancing lesion occupying the majority of the left lobe of the liver, difficult to accurately measure due to indistinct margins although at least 16.2 x 10.1 cm, containing a coil marking clip (series 2, image 24). Small gallstone in the contracted gallbladder. No biliary ductal dilatation. Pancreas: Unremarkable. No pancreatic ductal dilatation or surrounding inflammatory changes. Spleen: Normal in size without significant abnormality. Adrenals/Urinary Tract: Adrenal glands are unremarkable. Kidneys are normal, without renal calculi, solid lesion, or hydronephrosis. Bladder is unremarkable. Stomach/Bowel: Stomach is within normal limits. Appendix appears normal. No evidence of bowel wall thickening, distention, or inflammatory changes. 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 Numerous enlarged portal, gastrohepatic ligament, celiac axis, and retroperitoneal lymph nodes, largest celiac axis node measuring 2.6 x 2.2 cm (series 2, image 31). Reproductive: No mass or other significant abnormality. Other: No abdominal wall hernia or abnormality. Large volume ascites throughout the abdomen and pelvis. Musculoskeletal: No acute or significant osseous findings. IMPRESSION: 1. Coarse, nodular, cirrhotic morphology of the liver. Large, heterogeneously hypoenhancing lesion occupying the majority of the left lobe of the liver, difficult to accurately measure due to indistinct margins although at least 16.2 x 10.1 cm, containing a coil or clip. Findings are consistent with reported hepatocellular carcinoma, presumably post treatment. Comparison to prior imaging, if  available, would be helpful to assess for change. 2. Numerous enlarged portal, gastrohepatic ligament, celiac axis, and retroperitoneal lymph nodes, consistent with nodal metastatic disease. 3. 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 The central and right portal veins are patent without evidence of thrombus or other abnormality. 4. Large volume ascites throughout the abdomen and pelvis. 5. Multiple small bilateral pulmonary nodules measuring up to 0.5 cm in the included bilateral lung bases, concerning for pulmonary metastatic disease although nonspecific. As above, comparison to prior imaging would be helpful. 6. Trace right pleural effusion. 7. Cholelithiasis. Electronically Signed   By: Delanna Ahmadi M.D.   On: 08/11/2021 11:47    EKG: I independently viewed the EKG done and my findings are as followed: EKG was not done in the ED  Assessment/Plan Present on Admission:  Ascites  Principal Problem:   Ascites Active Problems:   Leukocytosis   Pulmonary nodules/lesions, multiple  Abdominal distention secondary to ascites Patient presented with abdominal distention due to ascites He states that he recently  had paracentesis and abdominal fluid buildup was within few days ago IR will be consulted for paracentesis  Leukocytosis rule out spontaneous bacterial peritonitis Patient was empirically started on IV ceftriaxone in the ED due to suspicion for spontaneous bacterial peritonitis Diagnostic paracentesis was not yet done, this could be done during paracentesis in the morning.  We shall continue with same at this time with a plan to discontinue/de-escalate based on findings  Alcohol use disorder/alcoholic cirrhosis Patient denies any recent alcohol consumption He was counseled on continued abstinence from alcohol use Continue to monitor patient and consider starting the patient on CIWA protocol for withdrawal symptoms  Acute kidney  injury BUN/creatinine 44/1.82 (last creatinine on record was 2 years ago and it was 1.0-1.2) Renally adjust medications, avoid nephrotoxic agents/dehydration/hypotension  Transaminitis This is possibly secondary to patient's history of alcohol use disorder/cirrhosis as well as due to hepatocellular carcinoma Continue to monitor liver enzymes  Elevated lipase Lipase 52; continue to monitor  Essential hypertension BP meds will be held at this time due to soft BP  Hepatocellular carcinoma Patient follows with Dr. Leamon Arnt at [facility] oncology with last visit being 07/12/2021 Patient will continue outpatient follow-up with oncologist  Multiple pulmonary nodules CT abdomen and pelvis showed multiple small bilateral pulmonary nodules measuring up to 0.5 cm in the included bilateral lung bases, concerning for pulmonary metastatic disease although nonspecific Continue follow-up with outpatient oncology  DVT prophylaxis: SCDs  Code Status: Full code  Family Communication: None at bedside  Disposition Plan:  Patient is from:                        home Anticipated DC to:                   SNF or family members home Anticipated DC date:               2-3 days Anticipated DC barriers:         Patient requires inpatient management due to abdominal distention requiring paracentesis and pending interventional radiology   Consults called: IR   Admission status: Inpatient    Bernadette Hoit MD [facility]  08/11/2021, 1:40 PM

## 2021-08-11 NOTE — ED Notes (Signed)
Attending at bedside assessing pt.

## 2021-08-11 NOTE — ED Notes (Signed)
Attempted again to pull blood off IV line after flushing it. Line won't pull back blood but flushes easily, no pain noted by pt, and no bleb/infiltration noted at site. Phlebotomy now to bedside to help collect d-dimer.

## 2021-08-11 NOTE — ED Notes (Signed)
Pt back from CT

## 2021-08-11 NOTE — ED Notes (Signed)
Attending O. Adefeso notified via secure chat that pt requesting something for his abdominal discomfort. Also notified that pt requesting to drink water if no procedure today requiring that he stay NPO. Awaiting orders.

## 2021-08-11 NOTE — ED Provider Notes (Signed)
[facility] Emergency Department Provider Note ____________________________________________   Event Date/Time   First MD Initiated Contact with Patient 08/11/21 [phone]     (approximate)  I have reviewed the triage vital signs and the nursing notes.  HISTORY  Chief Complaint Abd distention   HPI Johnny [name] is a 60 y.o. malewho presents to the ED for evaluation of abd distension.   Chart review indicates history of alcoholism, cirrhosis and HCC.  Recently admitted to [facility] from 12/12-12/15 due to altered mentation and malignant ascites.  Ammonia level normal.  Treated for pneumonia and meeting sepsis criteria then.  Diagnostic paracentesis reportedly low suspicion for SBP based off of this discharge summary.  Not discharged with antibiotics.  Patient presents to the ED, accompanied by his wife, for evaluation of increasing abdominal distention and generalized pain over the past few days.  Reports poor p.o. intake without emesis or diarrhea.  Denies melena or hematochezia.  Denies dysuria, but does report decreased urinary output. Reports moderately severe generalized abdominal pain without radiation.  Denies fevers, cough or dyspnea.  Denies confusion, and his wife reports that he seems to be mentating okay.  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 Authorizing Provider  LORazepam (ATIVAN) 0.5 MG tablet Take by mouth. 08/04/21 08/14/21 Yes [provider]  Multiple Vitamin (QUINTABS) TABS Take 1 tablet by mouth daily. 08/05/21 08/05/22 Yes [provider]  amLODipine (NORVASC) 10 MG tablet Take 10 mg by mouth daily. 04/22/20   [provider]  aspirin 81 MG chewable tablet Chew 1 tablet (81 mg total) by mouth daily. 02/22/19   Gladstone Lighter, MD  atorvastatin (LIPITOR) 40 MG tablet Take 1 tablet (40 mg total) by mouth daily at 6 PM. 02/22/19   Gladstone Lighter, MD  cyclobenzaprine (FLEXERIL) 10 MG tablet Take 10 mg by mouth 3 (three) times daily. 03/23/20   [provider]  Ferrous Sulfate (IRON) 325 (65 Fe) MG TABS Take 1 tablet by mouth 2 (two) times daily. 02/27/20   [provider]  folic acid (FOLVITE) 1 MG tablet Take 1 mg by mouth daily. 04/22/20   [provider]  gabapentin (NEURONTIN) 300 MG capsule Take 600 mg by mouth 3 (three) times daily. 04/22/20   [provider]  oxyCODONE (OXY IR/ROXICODONE) 5 MG immediate release tablet Take 5 mg by mouth every 4 (four) hours as needed. 07/26/21   [provider]  pantoprazole (PROTONIX) 40 MG tablet Take 40 mg by mouth 2 (two) times daily. 07/06/21   [provider]  propranolol (INDERAL) 20 MG tablet SMARTSIG:1 Tablet(s) By Mouth Every 12 Hours 04/22/20   [provider]  spironolactone (ALDACTONE) 25 MG tablet Take 1 tablet (25 mg total) by  mouth daily. 02/22/19   Gladstone Lighter, MD  thiamine 100 MG tablet Take 100 mg by mouth daily. 04/22/20   [provider]  traMADol (ULTRAM) 50 MG tablet Take 50 mg by mouth every 6 (six) hours as needed. 03/19/20   [provider]  VOLTAREN 1 % GEL Apply 1 g topically 3 (three) times daily. 02/17/20   [provider]  XIFAXAN 550 MG  TABS tablet Take 550 mg by mouth 2 (two) times daily. 04/22/20   [provider]    Allergies Patient has no known allergies.  No family history on file.  Social History Social History   Tobacco Use   Smoking status: Never   Smokeless tobacco: Never  Substance Use Topics   Alcohol use: Not Currently    Comment: 10 beers per day   Drug use: No    Review of Systems  Constitutional: No fever/chills Eyes: No visual changes. ENT: No sore throat. Cardiovascular: Denies chest pain. Respiratory: Denies shortness of breath. Gastrointestinal: Positive for abdominal distention and pain.  No nausea, no vomiting.  No diarrhea.  No constipation. Genitourinary: Negative for dysuria. Musculoskeletal: Negative for back pain. Skin: Negative for rash. Neurological: Negative for headaches, focal weakness or numbness.  ____________________________________________   PHYSICAL EXAM:  VITAL SIGNS: Vitals:   08/11/21 1030 08/11/21 1100  BP: 107/84 91/63  Pulse: 67 63  Resp: 20 17  Temp:    SpO2: 100% 100%     Constitutional: Alert and oriented. Well appearing and in no acute distress. Eyes: Conjunctivae are somewhat icteric. PERRL. EOMI. Head: Atraumatic. Nose: No congestion/rhinnorhea. Mouth/Throat: Mucous membranes are moist.  Oropharynx non-erythematous. Neck: No stridor. No cervical spine tenderness to palpation. Cardiovascular: Normal rate, regular rhythm. Grossly normal heart sounds.  Good peripheral circulation. Respiratory: Normal respiratory effort.  No retractions. Lungs CTAB. Gastrointestinal: Soft , distended and diffusely tender with voluntary guarding.  No clear peritoneal features. Musculoskeletal: No lower extremity tenderness nor edema.  No joint effusions. No signs of acute trauma. Neurologic:  Normal speech and language. No gross focal neurologic deficits are appreciated. No gait instability noted. Skin:  Skin is warm, dry and intact. No rash  noted. Psychiatric: Mood and affect are normal. Speech and behavior are normal.  ____________________________________________   LABS (all labs ordered are listed, but only abnormal results are displayed)  Labs Reviewed  LIPASE, BLOOD - Abnormal; Notable for the following components:      Result Value   Lipase 52 (*)    All other components within normal limits  COMPREHENSIVE METABOLIC PANEL - Abnormal; Notable for the following components:   Sodium 133 (*)    CO2 18 (*)    BUN 44 (*)    Creatinine, Ser 1.82 (*)    Albumin 2.8 (*)    AST 270 (*)    ALT 88 (*)    Alkaline Phosphatase 796 (*)    Total Bilirubin 3.1 (*)    GFR, Estimated 41 (*)    All other components within normal limits  CBC - Abnormal; Notable for the following components:   WBC 27.0 (*)    Hemoglobin 11.9 (*)    HCT 37.5 (*)    RDW 18.6 (*)    All other components within normal limits  RESP PANEL BY RT-PCR (FLU A&B, COVID) ARPGX2  URINALYSIS, ROUTINE W REFLEX MICROSCOPIC  PROTIME-INR   ____________________________________________  12 Lead EKG   ____________________________________________  RADIOLOGY  ED MD interpretation:    Official radiology report(s): CT ABDOMEN  PELVIS W CONTRAST  Result Date: 08/11/2021 CLINICAL DATA:  Cirrhotic, HCC, acute abdominal pain and distension, evaluate ascites, biliary obstruction, HCC metastases EXAM: CT ABDOMEN AND PELVIS WITH CONTRAST TECHNIQUE: Multidetector CT imaging of the abdomen and pelvis was performed using the standard protocol following bolus administration of intravenous contrast. CONTRAST:  51mL OMNIPAQUE IOHEXOL 300 MG/ML  SOLN COMPARISON:  None. FINDINGS: Lower chest: Trace right pleural effusion. Bandlike scarring and or atelectasis of the bilateral lung bases. Multiple small bilateral pulmonary nodules measuring up to 0.5 cm (series 4, image 6). Coronary artery calcifications. Hepatobiliary: Coarse, nodular, cirrhotic morphology of the liver. Large,  heterogeneously hypoenhancing lesion occupying the majority of the left lobe of the liver, difficult to accurately measure due to indistinct margins although at least 16.2 x 10.1 cm, containing a coil marking clip (series 2, image 24). Small gallstone in the contracted gallbladder. No biliary ductal dilatation. Pancreas: Unremarkable. No pancreatic ductal dilatation or surrounding inflammatory changes. Spleen: Normal in size without significant abnormality. Adrenals/Urinary Tract: Adrenal glands are unremarkable. Kidneys are normal, without renal calculi, solid lesion, or hydronephrosis. Bladder is unremarkable. Stomach/Bowel: Stomach is within normal limits. Appendix appears normal. No evidence of bowel wall thickening, distention, or inflammatory changes. 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 Numerous enlarged portal, gastrohepatic ligament, celiac axis, and retroperitoneal lymph nodes, largest celiac axis node measuring 2.6 x 2.2 cm (series 2, image 31). Reproductive: No mass or other significant abnormality. Other: No abdominal wall hernia or abnormality. Large volume ascites throughout the abdomen and pelvis. Musculoskeletal: No acute or significant osseous findings. IMPRESSION: 1. Coarse, nodular, cirrhotic morphology of the liver. Large, heterogeneously hypoenhancing lesion occupying the majority of the left lobe of the liver, difficult to accurately measure due to indistinct margins although at least 16.2 x 10.1 cm, containing a coil or clip. Findings are consistent with reported hepatocellular carcinoma, presumably post treatment. Comparison to prior imaging, if available, would be helpful to assess for change. 2. Numerous enlarged portal, gastrohepatic ligament, celiac axis, and retroperitoneal lymph nodes, consistent with nodal metastatic disease. 3. The main left portal vein is abruptly truncated in the vicinity  of above described liver mass. The central and right portal veins are patent without evidence of thrombus or other abnormality. 4. Large volume ascites throughout the abdomen and pelvis. 5. Multiple small bilateral pulmonary nodules measuring up to 0.5 cm in the included bilateral lung bases, concerning for pulmonary metastatic disease although nonspecific. As above, comparison to prior imaging would be helpful. 6. Trace right pleural effusion. 7. Cholelithiasis. Electronically Signed   By: Delanna Ahmadi M.D.   On: 08/11/2021 11:47    ____________________________________________   PROCEDURES and INTERVENTIONS  Procedure(s) performed (including Critical Care):  .1-3 Lead EKG Interpretation Performed by: Vladimir Crofts, MD Authorized by: Vladimir Crofts, MD     Interpretation: normal     ECG rate:  70   ECG rate assessment: normal     Rhythm: sinus rhythm     Ectopy: none     Conduction: normal    Medications  cefTRIAXone (ROCEPHIN) 1 g in sodium chloride 0.9 % 100 mL IVPB (0 g Intravenous Stopped 08/11/21 1101)  sodium chloride 0.9 % bolus 1,000 mL (1,000 mLs Intravenous New Bag/Given 08/11/21 1018)  morphine 4 MG/ML injection 4 mg (4 mg Intravenous Given 08/11/21 1101)  iohexol (OMNIPAQUE) 300 MG/ML solution 80 mL (80 mLs Intravenous Contrast Given 08/11/21 1124)  ____________________________________________   MDM / ED COURSE   60 year old male with known metastatic [place] presents to the ED with increasing abdominal distention and pain concerning for possibly SBP or at least just symptomatic ascites, requiring medical admission.  No evidence of sepsis or systemic illness, though his leukocytosis is noted.  CKD around baseline, and mild non-anion gap metabolic acidosis.  We will provide IV fluids and treat empirically for SBP with Rocephin.  CT abdomen/pelvis obtained which demonstrates progression of his known metastatic HCC and large volume ascites.      ____________________________________________   FINAL CLINICAL IMPRESSION(S) / ED DIAGNOSES  Final diagnoses:  Malignant ascites     ED Discharge Orders     None        Hailie Searight Tamala Julian   Note:  This document was prepared using Dragon voice recognition software and may include unintentional dictation errors.    Vladimir Crofts, MD 08/11/21 [phone]

## 2021-08-11 NOTE — ED Notes (Signed)
Resumed care from georgie rn.  Pt alert.  Nsr on monitor.  Iv in place.

## 2021-08-11 NOTE — ED Notes (Signed)
Pt resting in bed. NAD noted, respirations even and unlabored

## 2021-08-11 NOTE — ED Notes (Signed)
Called lab to get phlebotomy to assist in collecting blue tube. States only one phlebotomist on campus today for whole hospital but will send as soon as possible. This RN will attempt to collect again if other staff unable to stop by soon. Previous RN stated unable to collect off IV line as wouldn't pull back enough blood.

## 2021-08-12 ENCOUNTER — Inpatient Hospital Stay: Payer: Medicaid Other | Admitting: Radiology

## 2021-08-12 DIAGNOSIS — N179 Acute kidney failure, unspecified: Secondary | ICD-10-CM | POA: Diagnosis not present

## 2021-08-12 DIAGNOSIS — K7031 Alcoholic cirrhosis of liver with ascites: Secondary | ICD-10-CM

## 2021-08-12 DIAGNOSIS — R748 Abnormal levels of other serum enzymes: Secondary | ICD-10-CM | POA: Diagnosis not present

## 2021-08-12 HISTORY — PX: IR PARACENTESIS: IMG2679

## 2021-08-12 LAB — COMPREHENSIVE METABOLIC PANEL
ALT: 103 U/L — ABNORMAL HIGH (ref 0–44)
AST: 319 U/L — ABNORMAL HIGH (ref 15–41)
Albumin: 2.8 g/dL — ABNORMAL LOW (ref 3.5–5.0)
Alkaline Phosphatase: 988 U/L — ABNORMAL HIGH (ref 38–126)
Anion gap: 13 (ref 5–15)
BUN: 50 mg/dL — ABNORMAL HIGH (ref 8–23)
CO2: 16 mmol/L — ABNORMAL LOW (ref 22–32)
Calcium: 9.1 mg/dL (ref 8.9–10.3)
Chloride: 107 mmol/L (ref 98–111)
Creatinine, Ser: 1.99 mg/dL — ABNORMAL HIGH (ref 0.61–1.24)
GFR, Estimated: 37 mL/min — ABNORMAL LOW (ref >60)
Glucose, Bld: 71 mg/dL (ref 70–99)
Potassium: 4.6 mmol/L (ref 3.5–5.1)
Sodium: 136 mmol/L (ref 135–145)
Total Bilirubin: 4.2 mg/dL — ABNORMAL HIGH (ref 0.3–1.2)
Total Protein: 7 g/dL (ref 6.5–8.1)

## 2021-08-12 LAB — CBC
HCT: 34.6 % — ABNORMAL LOW (ref 39.0–52.0)
Hemoglobin: 11.4 g/dL — ABNORMAL LOW (ref 13.0–17.0)
MCH: 28.1 pg (ref 26.0–34.0)
MCHC: 32.9 g/dL (ref 30.0–36.0)
MCV: 85.4 fL (ref 80.0–100.0)
Platelets: 294 10*3/uL (ref 150–400)
RBC: 4.05 MIL/uL — ABNORMAL LOW (ref 4.22–5.81)
RDW: 19.1 % — ABNORMAL HIGH (ref 11.5–15.5)
WBC: 22.4 10*3/uL — ABNORMAL HIGH (ref 4.0–10.5)
nRBC: 0 % (ref 0.0–0.2)

## 2021-08-12 LAB — HIV ANTIBODY (ROUTINE TESTING W REFLEX): HIV Screen 4th Generation wRfx: NONREACTIVE

## 2021-08-12 LAB — MAGNESIUM: Magnesium: 2.4 mg/dL (ref 1.7–2.4)

## 2021-08-12 LAB — PHOSPHORUS: Phosphorus: 6.5 mg/dL — ABNORMAL HIGH (ref 2.5–4.6)

## 2021-08-12 MED ORDER — MELATONIN 5 MG PO TABS
2.5000 mg | ORAL_TABLET | Freq: Every day | ORAL | Status: DC
  Administered 2021-08-12 – 2021-08-14 (×3): 2.5 mg via ORAL
  Filled 2021-08-12 (×3): qty 1

## 2021-08-12 MED ORDER — RIFAXIMIN 550 MG PO TABS
550.0000 mg | ORAL_TABLET | Freq: Two times a day (BID) | ORAL | Status: DC
  Administered 2021-08-12 – 2021-08-15 (×6): 550 mg via ORAL
  Filled 2021-08-12 (×7): qty 1

## 2021-08-12 MED ORDER — ALBUMIN HUMAN 25 % IV SOLN
50.0000 g | Freq: Four times a day (QID) | INTRAVENOUS | Status: AC
  Administered 2021-08-12 – 2021-08-13 (×4): 50 g via INTRAVENOUS
  Filled 2021-08-12 (×4): qty 200

## 2021-08-12 MED ORDER — PANTOPRAZOLE SODIUM 40 MG PO TBEC
40.0000 mg | DELAYED_RELEASE_TABLET | Freq: Two times a day (BID) | ORAL | Status: DC
  Administered 2021-08-12 – 2021-08-13 (×3): 40 mg via ORAL
  Filled 2021-08-12 (×3): qty 1

## 2021-08-12 MED ORDER — SODIUM BICARBONATE 650 MG PO TABS
1300.0000 mg | ORAL_TABLET | Freq: Four times a day (QID) | ORAL | Status: DC
  Administered 2021-08-12 – 2021-08-15 (×11): 1300 mg via ORAL
  Filled 2021-08-12 (×12): qty 2

## 2021-08-12 MED ORDER — ADULT MULTIVITAMIN W/MINERALS CH
1.0000 | ORAL_TABLET | Freq: Every day | ORAL | Status: DC
  Administered 2021-08-13 – 2021-08-15 (×3): 1 via ORAL
  Filled 2021-08-12 (×3): qty 1

## 2021-08-12 MED ORDER — QUINTABS PO TABS: 1.0000 | ORAL_TABLET | Freq: Every day | ORAL | Status: DC

## 2021-08-12 MED ORDER — LIDOCAINE HCL 1 % IJ SOLN
INTRAMUSCULAR | Status: AC
  Administered 2021-08-12: 13:00:00 7 mL
  Filled 2021-08-12: qty 20

## 2021-08-12 MED ORDER — SPIRONOLACTONE 25 MG PO TABS
25.0000 mg | ORAL_TABLET | Freq: Every day | ORAL | Status: DC
  Administered 2021-08-12 – 2021-08-15 (×4): 25 mg via ORAL
  Filled 2021-08-12 (×4): qty 1

## 2021-08-12 NOTE — ED Notes (Signed)
Pt to thoracentesis with RN then to IP floor

## 2021-08-12 NOTE — ED Notes (Signed)
Raquel RN aware of assigned bed

## 2021-08-12 NOTE — Procedures (Signed)
PROCEDURE SUMMARY:  Successful US guided paracentesis from LLQ.  Yielded 3.3 L of clear yellow fluid.  No immediate complications.  Pt complained of pain at 3.3 Liters therefore the procedure was stopped.  Specimen was not sent for labs.  EBL < 58mL  Hedy Jacob PA-C 08/12/2021 2:20 PM

## 2021-08-12 NOTE — Progress Notes (Addendum)
PROGRESS NOTE  Johnny [name]:[id number] DOB: 08/17/1961 DOA: 08/11/2021 PCP: Theotis Burrow, MD  HPI/Recap of past 24 hours: Johnny [name] is a 60 y.o. male with medical history significant for GERD, HTN, alcohol use with alcoholic cirrhosis, metastatic [place], recently admitted to [facility] on 08/01/2021 until 08/04/2021 due to altered mental status secondary to pneumonia was treated with vancomycin and Zosyn.  Patient presents this time to [facility] ED due to gradually worsening abdominal distention.  Associated with poor oral intake, pain in right and left upper abdominal quadrants.  Work-up revealed large ascites, leukocytosis with concern for SBP.  He was started on empiric antibiotics Rocephin due to concern for intra-abdominal infection.  Paracentesis performed on 08/12/2021, will follow fluid analysis and culture.    08/12/2021: Patient was seen and examined at his bedside in the ED prior to paracentesis.  His ex-wife was present in the hallway.  He reports abdominal distention making it uncomfortable to breathe and eat.   Assessment/Plan: Principal Problem:   Ascites Active Problems:   Leukocytosis   Pulmonary nodules/lesions, multiple   Abdominal distention  Severe abdominal distention secondary to large ascites Patient presented with abdominal distention due to ascites He states that he recently had paracentesis and abdominal fluid buildup was within few days Post paracentesis by IR on 08/12/2021 with 3.3 L removed. Follow-up body fluid analysis and culture Continue empiric IV antibiotic Rocephin Continue albumin infusion.  Decompensated alcoholic cirrhosis Large ascites, transaminitis Resume home diuretic spironolactone Resume home rifaximin and p.o. Protonix 40 mg twice daily Restart propranolol once blood pressure is stable, currently soft BPs. Low-sodium diet Goal bowel movements 2-3 bowel movement per day GI consult  Hepatocellular carcinoma with  nodal metastatic disease Multiple small bilateral pulmonary nodules measuring up to 2.5 cm, concerning for pulmonary metastatic disease Follows with oncology at [facility], will need close follow-up  Jaundice/hyperbilirubinemia with cholelithiasis seen on CT scan Meld score 21, unclear if he would be a surgical candidate T.  Bili, LFTs are up-trending Repeat CMP and lipase  Leukocytosis rule out spontaneous bacterial peritonitis Patient was empirically started on IV ceftriaxone in the ED, continue, due to suspicion for spontaneous bacterial peritonitis  Worsening acute kidney injury, concern for developing hepatorenal syndrome Creatinine 1.15 with GFR greater than 60 in 2020. Presented with creatinine of 1.82 with GFR 41 Creatinine uptrending 1.99 with GFR of 37. Avoid nephrotoxic agents and hypotension. Continue IV albumin for now Nephrology consult  Anion gap metabolic acidosis Serum bicarb 16, anion gap 13 Start oral sodium bicarb replacement Repeat renal function in the morning   Transaminitis in the setting of alcoholic cirrhosis Avoid hepatotoxic agents Trend LFTs   Elevated lipase Lipase 52; continue to monitor  Essential hypertension, BPs are soft. Maintain MAP greater than 65 Beta-blocker on hold until blood pressures are improved.  Hepatocellular carcinoma Patient follows with Dr. Leamon Arnt at [facility] oncology with last visit being 07/12/2021 Patient will continue outpatient follow-up with oncologist   Multiple pulmonary nodules CT abdomen and pelvis showed multiple small bilateral pulmonary nodules measuring up to 0.5 cm in the included bilateral lung bases, concerning for pulmonary metastatic disease although nonspecific Continue follow-up with outpatient oncology  Alcohol use disorder/alcoholic cirrhosis Patient denies any recent alcohol consumption He was counseled on continued abstinence from alcohol use No evidence of alcohol withdrawal at the time of the  visit.  Physical debility PT OT to assess Fall precautions  Goals of care Meld score of 21 with estimated 23-month mortality 19.6% Palliative  care consult   Critical care time: 65 minutes.     DVT prophylaxis: SCDs   Code Status: Full code   Family Communication: Ex-wife at bedside   Disposition Plan:  Patient is from:                        home Anticipated DC to:                   SNF or family members home Anticipated DC date:               2-3 days Anticipated DC barriers:         Patient requires inpatient management due to abdominal distention requiring paracentesis and pending interventional radiology   Consults called: IR, GI, Nephrology   Admission status: Inpatient   Status is: Inpatient  Patient requires at least 2 midnights for further evaluation and treatment of present condition.      Objective: Vitals:   08/12/21 0738 08/12/21 1058 08/12/21 1405 08/12/21 1532  BP: 107/84 118/88 118/80 119/81  Pulse: 90 90 83 97  Resp: 18 18 18 18   Temp: 98 F (36.7 C)  98.9 F (37.2 C) 98.6 F (37 C)  TempSrc: Oral     SpO2: 96% 99% 97% 100%  Weight:      Height:       No intake or output data in the 24 hours ending 08/12/21 1541 Filed Weights   08/11/21 0743  Weight: 62.1 kg    Exam:  General: 60 y.o. year-old male frail-appearing in no acute distress.  Alert and oriented x3.  Icteric sclera. Cardiovascular: Regular rate and rhythm with no rubs or gallops.  No thyromegaly or JVD noted.   Respiratory: Clear to auscultation with no wheezes or rales. Good inspiratory effort. Abdomen: Severely distended diffuse tenderness with palpation.  Bowel sounds present. Musculoskeletal: Trace lower extremity edema bilaterally.   Skin: No ulcerative lesions noted or rashes, Psychiatry: Mood is appropriate for condition and setting Neuro: Moves all 4 extremities.   Data Reviewed: CBC: Recent Labs  Lab 08/11/21 0747 08/12/21 0640  WBC 27.0* 22.4*  HGB  11.9* 11.4*  HCT 37.5* 34.6*  MCV 86.4 85.4  PLT 341 329   Basic Metabolic Panel: Recent Labs  Lab 08/11/21 0747 08/12/21 0640  NA 133* 136  K 4.7 4.6  CL 107 107  CO2 18* 16*  GLUCOSE 98 71  BUN 44* 50*  CREATININE 1.82* 1.99*  CALCIUM 9.1 9.1  MG  --  2.4  PHOS  --  6.5*   GFR: Estimated Creatinine Clearance: 33.8 mL/min (A) (by C-G formula based on SCr of 1.99 mg/dL (H)). Liver Function Tests: Recent Labs  Lab 08/11/21 0747 08/12/21 0640  AST 270* 319*  ALT 88* 103*  ALKPHOS 796* 988*  BILITOT 3.1* 4.2*  PROT 7.1 7.0  ALBUMIN 2.8* 2.8*   Recent Labs  Lab 08/11/21 0747  LIPASE 52*   No results for input(s): AMMONIA in the last 168 hours. Coagulation Profile: Recent Labs  Lab 08/11/21 1311  INR 1.1   Cardiac Enzymes: No results for input(s): CKTOTAL, CKMB, CKMBINDEX, TROPONINI in the last 168 hours. BNP (last 3 results) No results for input(s): PROBNP in the last 8760 hours. HbA1C: No results for input(s): HGBA1C in the last 72 hours. CBG: No results for input(s): GLUCAP in the last 168 hours. Lipid Profile: No results for input(s): CHOL, HDL, LDLCALC, TRIG, CHOLHDL, LDLDIRECT in the last  72 hours. Thyroid Function Tests: No results for input(s): TSH, T4TOTAL, FREET4, T3FREE, THYROIDAB in the last 72 hours. Anemia Panel: No results for input(s): VITAMINB12, FOLATE, FERRITIN, TIBC, IRON, RETICCTPCT in the last 72 hours. Urine analysis:    Component Value Date/Time   COLORURINE AMBER (A) 08/11/2021 1812   APPEARANCEUR CLEAR 08/11/2021 1812   LABSPEC 1.010 08/11/2021 1812   PHURINE 5.0 08/11/2021 1812   GLUCOSEU NEGATIVE 08/11/2021 1812   HGBUR NEGATIVE 08/11/2021 1812   BILIRUBINUR MODERATE (A) 08/11/2021 1812   KETONESUR 15 (A) 08/11/2021 1812   PROTEINUR 30 (A) 08/11/2021 1812   NITRITE NEGATIVE 08/11/2021 1812   LEUKOCYTESUR NEGATIVE 08/11/2021 1812   Sepsis Labs: @LABRCNTIP (procalcitonin:4,lacticidven:4)  ) Recent Results (from the  past 240 hour(s))  Resp Panel by RT-PCR (Flu A&B, Covid) Nasopharyngeal Swab     Status: None   Collection Time: 08/11/21 10:19 AM   Specimen: Nasopharyngeal Swab; Nasopharyngeal(NP) swabs in vial transport medium  Result Value Ref Range Status   SARS Coronavirus 2 by RT PCR NEGATIVE NEGATIVE Final    Comment: (NOTE) SARS-CoV-2 target nucleic acids are NOT DETECTED.  The SARS-CoV-2 RNA is generally detectable in upper respiratory specimens during the acute phase of infection. The lowest concentration of SARS-CoV-2 viral copies this assay can detect is 138 copies/mL. A negative result does not preclude SARS-Cov-2 infection and should not be used as the sole basis for treatment or other patient management decisions. A negative result may occur with  improper specimen collection/handling, submission of specimen other than nasopharyngeal swab, presence of viral mutation(s) within the areas targeted by this assay, and inadequate number of viral copies(<138 copies/mL). A negative result must be combined with clinical observations, patient history, and epidemiological information. The expected result is Negative.  Fact Sheet for Patients:  EntrepreneurPulse.com.au  Fact Sheet for Healthcare Providers:  IncredibleEmployment.be  This test is no t yet approved or cleared by the [place] FDA and  has been authorized for detection and/or diagnosis of SARS-CoV-2 by FDA under an Emergency Use Authorization (EUA). This EUA will remain  in effect (meaning this test can be used) for the duration of the COVID-19 declaration under Section 564(b)(1) of the Act, 21 U.S.C.section 360bbb-3(b)(1), unless the authorization is terminated  or revoked sooner.       Influenza A by PCR NEGATIVE NEGATIVE Final   Influenza B by PCR NEGATIVE NEGATIVE Final    Comment: (NOTE) The Xpert Xpress SARS-CoV-2/FLU/RSV plus assay is intended as an aid in the diagnosis of  influenza from Nasopharyngeal swab specimens and should not be used as a sole basis for treatment. Nasal washings and aspirates are unacceptable for Xpert Xpress SARS-CoV-2/FLU/RSV testing.  Fact Sheet for Patients: EntrepreneurPulse.com.au  Fact Sheet for Healthcare Providers: IncredibleEmployment.be  This test is not yet approved or cleared by the [place] FDA and has been authorized for detection and/or diagnosis of SARS-CoV-2 by FDA under an Emergency Use Authorization (EUA). This EUA will remain in effect (meaning this test can be used) for the duration of the COVID-19 declaration under Section 564(b)(1) of the Act, 21 U.S.C. section 360bbb-3(b)(1), unless the authorization is terminated or revoked.  Performed at [facility], [address] [place],  [address]       Studies: No results found.  Scheduled Meds:  [START ON 08/13/2021] multivitamin with minerals  1 tablet Oral Daily   pantoprazole  40 mg Oral BID   rifaximin  550 mg Oral BID   spironolactone  25 mg Oral  Daily    Continuous Infusions:  cefTRIAXone (ROCEPHIN)  IV Stopped (08/12/21 1258)     LOS: 1 day     Kayleen Memos, MD [facility] Pager [phone]  If 7PM-7AM, please contact night-coverage [email or web address] Password [facility] 08/12/2021, 3:41 PM

## 2021-08-12 NOTE — Progress Notes (Signed)
Patient wants to go AMA, I have done a very simple assessment as he refuses to let me examine his skin and doesn't want me to touch him anymore.  I have stated to the family that I can not give him anything to calm him down in order to make him stay.  He is irritated that he is in the hospital as he was not expecting to be admitted after his procedure.  We are going to wait for his sister to arrive before he makes his final decision.  He wants to leave with his IV's in and come back Monday I have advised him that I will need to remove his IV's prior to him leaving.

## 2021-08-12 NOTE — ED Notes (Signed)
Pt laying in bed with out distress noted at this time. Pt is disoriented at this time.

## 2021-08-12 NOTE — ED Notes (Signed)
Report received from Santia RN. Patient care assumed. Patient/RN introduction complete. Will continue to monitor.  °

## 2021-08-12 NOTE — ED Notes (Addendum)
Called IP unit for patient handoff. This rn was informed that the correct nurse would be taking report on the patient.

## 2021-08-12 NOTE — ED Notes (Signed)
Family at bedside, no change in condition.

## 2021-08-12 NOTE — Consult Note (Addendum)
Johnny Antigua, MD [address], [place], [place], [place], [address] [address], [place], [place], [place], [address] Phone: [phone]  Fax: [phone]  Consultation  Referring Provider:     Dr. Nevada Crane Primary Care Physician:  Johnny Mires Elyse Jarvis, MD Reason for Consultation:     Cirrhosis Primary Gastroenterologist: [facility]  Date of Admission:  08/11/2021 Date of Consultation:  08/12/2021         HPI:   Johnny [name] is a 60 y.o. male with history of alcoholic and hepatitis C liver cirrhosis, HCC, admitted with increasing abdominal distention, abdominal discomfort, noncompliance to medications as an outpatient.  Family at bedside and states that patient has stopped taking his medication and has been drinking at home.  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 Treatment plan was "He said that he would like to proceed with durvalumab plus tremelimumab and we will arrange this for when there is an opening in the treatment room and tremelimumab is available in the pharmacy." This has not been started yet  He was recently admitted to [facility] on 08/01/2021, and was noted to have an elevated white count, confusion, and treated for pneumonia and sepsis.  As per discharge summary diagnostic paracentesis was done but low suspicion for SBP.  Liver enzymes were found to be elevated on that admission as well  Patient is alert and oriented at this time.  Denies any episodes of bleeding at home.  Has been noncompliant with medications at home.  No nausea or vomiting.  Past Medical History:  Diagnosis Date   Alcoholism ([place])    Ascites    Cancer ([place])    Hep C w/ coma, chronic     Past  Surgical History:  Procedure Laterality Date   COLONOSCOPY WITH PROPOFOL N/A 02/16/2017   Procedure: COLONOSCOPY WITH PROPOFOL;  Surgeon: Lollie Sails, MD;  Location: [facility] ENDOSCOPY;  Service: Endoscopy;  Laterality: N/A;   ESOPHAGOGASTRODUODENOSCOPY (EGD) WITH PROPOFOL N/A 02/16/2017   Procedure: ESOPHAGOGASTRODUODENOSCOPY (EGD) WITH PROPOFOL;  Surgeon: Lollie Sails, MD;  Location: [facility] ENDOSCOPY;  Service: Endoscopy;  Laterality: N/A;   IR PARACENTESIS  08/12/2021   PARACENTESIS      Prior to Admission medications   Medication Sig Start Date End Date Taking? Authorizing Provider  amLODipine (NORVASC) 10 MG tablet Take 10 mg by mouth daily. 04/22/20  Yes [provider]  aspirin 81 MG chewable tablet Chew 1 tablet (81 mg total) by mouth daily. 02/22/19  Yes Johnny Lighter, MD  Ferrous Sulfate (IRON) 325 (65 Fe) MG TABS Take 1 tablet by mouth 2 (two) times daily. 02/27/20  Yes [provider]  folic acid (FOLVITE) 1 MG tablet Take 1 mg by mouth daily. 04/22/20  Yes [provider]  Multiple Vitamin (QUINTABS) TABS Take 1 tablet by mouth daily. 08/05/21 08/05/22 Yes [provider]  oxyCODONE (OXY IR/ROXICODONE) 5 MG immediate release tablet Take 5 mg by mouth every 4 (four) hours as needed. 07/26/21  Yes [provider]  pantoprazole (PROTONIX) 40 MG tablet Take 40 mg by mouth 2 (two) times daily. 07/06/21  Yes [provider]  propranolol (INDERAL) 20 MG tablet SMARTSIG:1 Tablet(s) By Mouth Every 12 Hours 04/22/20  Yes [provider]  spironolactone (ALDACTONE) 25 MG tablet Take 1 tablet (25 mg total) by mouth daily. 02/22/19  Yes Johnny Lighter, MD  thiamine 100 MG tablet Take 100 mg by mouth daily. 04/22/20  Yes [provider]  XIFAXAN 550 MG TABS tablet Take 550 mg by mouth 2 (two) times daily. 04/22/20  Yes [provider]  zinc gluconate 50 MG tablet Take 50 mg by mouth daily.   Yes [provider]  atorvastatin (LIPITOR) 40 MG tablet Take 1 tablet (40 mg total) by mouth daily at 6 PM. Patient not taking: Reported on 08/11/2021 02/22/19   Johnny Lighter, MD  cyclobenzaprine (FLEXERIL) 10 MG tablet Take 10 mg by mouth 3 (three) times daily. Patient not taking: Reported on 08/11/2021 03/23/20   [provider]  gabapentin (NEURONTIN) 300 MG capsule Take 600 mg by mouth 3 (three) times daily. Patient not taking: Reported on 08/11/2021 04/22/20   [provider]  LORazepam (ATIVAN) 0.5 MG tablet Take by mouth. Patient not taking: Reported on 08/11/2021 08/04/21 08/14/21  [provider]  traMADol (ULTRAM) 50 MG tablet Take 50 mg by mouth every 6 (six) hours as needed. Patient not taking: Reported on 08/11/2021 03/19/20   [provider]  VOLTAREN 1 % GEL Apply 1 g topically 3 (three) times daily. Patient not taking: Reported on 08/11/2021 02/17/20   [provider]    No family history on file.   Social History   Tobacco Use   Smoking status: Never   Smokeless tobacco: Never  Substance Use Topics   Alcohol use: Not Currently    Comment: 10 beers per day   Drug use: No    Allergies as of 08/11/2021   (No Known Allergies)    Review of Systems:    All systems reviewed and negative except where noted in HPI.   Physical Exam:  Constitutional: General:   Alert,  Well-developed, well-nourished, pleasant and cooperative in NAD BP 119/81 (BP Location: Right Arm)    Pulse 97    Temp 98.6 F (37 C)    Resp 18    Ht 5\' 7"  (1.702 m)    Wt 62.1 kg    SpO2 100%    BMI 21.46 kg/m   Eyes:  Sclera clear, no icterus.   Conjunctiva pink. PERRLA  Ears:  No scars, lesions or masses, Normal auditory acuity. Nose:  No deformity, discharge, or lesions. Mouth:  No deformity or lesions, oropharynx pink & moist.  Neck:  Supple; no masses or thyromegaly.  Respiratory: Normal respiratory effort, Normal percussion  Gastrointestinal:   Normal bowel sounds.  No bruits.  Visibly distended abdomen soft, non-tender. No guarding or rebound tenderness.   Unable to assess hepatosplenomegaly due to ascites distended abdomen  Cardiac: No clubbing or edema.  No cyanosis. Normal posterior tibial pedal pulses noted.  Lymphatic:  No significant cervical or axillary adenopathy.  Psych:  Alert and cooperative. Normal mood and affect.  Musculoskeletal:  Normal gait. Head normocephalic, atraumatic. Symmetrical without gross deformities. 5/5 Upper and Lower extremity strength bilaterally.  Skin: Warm. Intact without significant lesions or rashes. No jaundice.  Neurologic:  Face symmetrical, tongue midline, Normal sensation to touch;  grossly normal neurologically.  Psych:  Alert and oriented x3, Alert and cooperative. Normal mood and affect.   LAB RESULTS: Recent Labs    08/11/21 0747 08/12/21 0640  WBC 27.0* 22.4*  HGB 11.9* 11.4*  HCT 37.5* 34.6*  PLT 341 294   BMET Recent Labs  08/11/21 0747 08/12/21 0640  NA 133* 136  K 4.7 4.6  CL 107 107  CO2 18* 16*  GLUCOSE 98 71  BUN 44* 50*  CREATININE 1.82* 1.99*  CALCIUM 9.1 9.1   LFT Recent Labs    08/12/21 0640  PROT 7.0  ALBUMIN 2.8*  AST 319*  ALT 103*  ALKPHOS 988*  BILITOT 4.2*   PT/INR Recent Labs    08/11/21 1311  LABPROT 14.0  INR 1.1    STUDIES: CT ABDOMEN PELVIS W CONTRAST  Result Date: 08/11/2021 CLINICAL DATA:  Cirrhotic, HCC, acute abdominal pain and distension, evaluate ascites, biliary obstruction, HCC metastases EXAM: CT ABDOMEN AND PELVIS WITH CONTRAST TECHNIQUE: Multidetector CT imaging of the abdomen and pelvis was performed using the standard protocol following bolus administration of intravenous contrast. CONTRAST:  28mL OMNIPAQUE IOHEXOL 300 MG/ML  SOLN COMPARISON:  None. FINDINGS: Lower chest: Trace right pleural effusion. Bandlike scarring and or atelectasis of the bilateral lung bases. Multiple small bilateral pulmonary nodules  measuring up to 0.5 cm (series 4, image 6). Coronary artery calcifications. Hepatobiliary: Coarse, nodular, cirrhotic morphology of the liver. Large, heterogeneously hypoenhancing lesion occupying the majority of the left lobe of the liver, difficult to accurately measure due to indistinct margins although at least 16.2 x 10.1 cm, containing a coil marking clip (series 2, image 24). Small gallstone in the contracted gallbladder. No biliary ductal dilatation. Pancreas: Unremarkable. No pancreatic ductal dilatation or surrounding inflammatory changes. Spleen: Normal in size without significant abnormality. Adrenals/Urinary Tract: Adrenal glands are unremarkable. Kidneys are normal, without renal calculi, solid lesion, or hydronephrosis. Bladder is unremarkable. Stomach/Bowel: Stomach is within normal limits. Appendix appears normal. No evidence of bowel wall thickening, distention, or inflammatory changes. 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 Numerous enlarged portal, gastrohepatic ligament, celiac axis, and retroperitoneal lymph nodes, largest celiac axis node measuring 2.6 x 2.2 cm (series 2, image 31). Reproductive: No mass or other significant abnormality. Other: No abdominal wall hernia or abnormality. Large volume ascites throughout the abdomen and pelvis. Musculoskeletal: No acute or significant osseous findings. IMPRESSION: 1. Coarse, nodular, cirrhotic morphology of the liver. Large, heterogeneously hypoenhancing lesion occupying the majority of the left lobe of the liver, difficult to accurately measure due to indistinct margins although at least 16.2 x 10.1 cm, containing a coil or clip. Findings are consistent with reported hepatocellular carcinoma, presumably post treatment. Comparison to prior imaging, if available, would be helpful to assess for change. 2. Numerous enlarged portal, gastrohepatic ligament,  celiac axis, and retroperitoneal lymph nodes, consistent with nodal metastatic disease. 3. 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 The central and right portal veins are patent without evidence of thrombus or other abnormality. 4. Large volume ascites throughout the abdomen and pelvis. 5. Multiple small bilateral pulmonary nodules measuring up to 0.5 cm in the included bilateral lung bases, concerning for pulmonary metastatic disease although nonspecific. As above, comparison to prior imaging would be helpful. 6. Trace right pleural effusion. 7. Cholelithiasis. Electronically Signed   By: Delanna Ahmadi M.D.   On: 08/11/2021 11:47   IR Paracentesis  Result Date: 08/12/2021 INDICATION: Recurrent ascites request received for paracentesis. EXAM: ULTRASOUND GUIDED PARACENTESIS MEDICATIONS: Local 1% lidocaine only. COMPLICATIONS: None immediate. PROCEDURE: Informed written consent was obtained from the patient after a discussion of the risks, benefits and alternatives to treatment. A timeout was  performed prior to the initiation of the procedure. Initial ultrasound scanning demonstrates a large amount of ascites within the left lower abdominal quadrant. The left lower abdomen was prepped and draped in the usual sterile fashion. 1% lidocaine was used for local anesthesia. Following this, a 19 gauge, 7-cm, Yueh catheter was introduced. An ultrasound image was saved for documentation purposes. The paracentesis was performed. The patient complained of pain therefore the procedure was stopped. The catheter was removed and a dressing was applied. The patient tolerated the procedure well without immediate post procedural complication. FINDINGS: A total of approximately 3.3 L of clear yellow fluid was removed. IMPRESSION: Successful ultrasound-guided paracentesis yielding 3.3 liters of peritoneal fluid. This exam was performed by Tsosie Billing PA-C, and was supervised  and interpreted by Dr. Pascal Lux. Electronically Signed   By: Sandi Mariscal M.D.   On: 08/12/2021 16:13      Impression / Plan:   Johnny [name] is a 60 y.o. 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  As per interventional radiology note, specimen was not sent for labs and therefore labs are not available at this time for evaluation of SBP  Ascitic fluid labs from 08/02/2021 reviewed in [facility] and are not consistent with SBP.  Cytology was negative at that time as well  Patient is likely to need further paracentesis on this admission given his persistently distended abdomen.  Recommend obtaining fluid studies including fluid albumin, cell count and cultures  Sodium restriction to less than 2000 mg a day recommended  Abstinence from alcohol highly encouraged  Patient has been started on low-dose spironolactone at 25 mg a day.  Await nephrology consult at this time.  Increase in diuretics may be limited by renal function.  If renal function worsens, discontinue spironolactone altogether  Elevated liver enzymes are likely due to ongoing alcohol use, given the greater than 2:1 ratio of AST to ALT, and his known [place] with infiltrative tumor seen in the liver on CT scans at [facility] and yesterday at [facility].  No biliary ductal dilatation reported on CT scan on this admission and at [facility].  Continue rifaximin  CIWA protocol Folate, thiamine  Agree with palliative care consult to establish goals of care  Consider oncology consult as well  Meld NA 22.  However, given his ongoing alcohol abuse, patient unlikely to be a liver transplant candidate at this time.  However, referral for liver  transplant evaluation can be placed by his primary GI when appropriate  Thank you for involving me in the care of this patient.      LOS: 1 day   Virgel Manifold, MD  08/12/2021, 9:01 PM

## 2021-08-12 NOTE — ED Notes (Addendum)
Pt with no co pain or discomfort at this time, awaiting paracentesis.

## 2021-08-13 DIAGNOSIS — C220 Liver cell carcinoma: Principal | ICD-10-CM

## 2021-08-13 DIAGNOSIS — D649 Anemia, unspecified: Secondary | ICD-10-CM

## 2021-08-13 DIAGNOSIS — K767 Hepatorenal syndrome: Secondary | ICD-10-CM

## 2021-08-13 DIAGNOSIS — K7031 Alcoholic cirrhosis of liver with ascites: Secondary | ICD-10-CM | POA: Diagnosis not present

## 2021-08-13 LAB — COMPREHENSIVE METABOLIC PANEL
ALT: 74 U/L — ABNORMAL HIGH (ref 0–44)
AST: 243 U/L — ABNORMAL HIGH (ref 15–41)
Albumin: 4.2 g/dL (ref 3.5–5.0)
Alkaline Phosphatase: 850 U/L — ABNORMAL HIGH (ref 38–126)
Anion gap: 12 (ref 5–15)
BUN: 50 mg/dL — ABNORMAL HIGH (ref 8–23)
CO2: 17 mmol/L — ABNORMAL LOW (ref 22–32)
Calcium: 9.3 mg/dL (ref 8.9–10.3)
Chloride: 105 mmol/L (ref 98–111)
Creatinine, Ser: 1.56 mg/dL — ABNORMAL HIGH (ref 0.61–1.24)
GFR, Estimated: 50 mL/min — ABNORMAL LOW (ref >60)
Glucose, Bld: 84 mg/dL (ref 70–99)
Potassium: 4.1 mmol/L (ref 3.5–5.1)
Sodium: 134 mmol/L — ABNORMAL LOW (ref 135–145)
Total Bilirubin: 5.3 mg/dL — ABNORMAL HIGH (ref 0.3–1.2)
Total Protein: 6.7 g/dL (ref 6.5–8.1)

## 2021-08-13 LAB — CBC
HCT: 23.7 % — ABNORMAL LOW (ref 39.0–52.0)
Hemoglobin: 8.1 g/dL — ABNORMAL LOW (ref 13.0–17.0)
MCH: 28.3 pg (ref 26.0–34.0)
MCHC: 34.2 g/dL (ref 30.0–36.0)
MCV: 82.9 fL (ref 80.0–100.0)
Platelets: 217 10*3/uL (ref 150–400)
RBC: 2.86 MIL/uL — ABNORMAL LOW (ref 4.22–5.81)
RDW: 19.3 % — ABNORMAL HIGH (ref 11.5–15.5)
WBC: 19.4 10*3/uL — ABNORMAL HIGH (ref 4.0–10.5)
nRBC: 0 % (ref 0.0–0.2)

## 2021-08-13 LAB — PHOSPHORUS: Phosphorus: 4.8 mg/dL — ABNORMAL HIGH (ref 2.5–4.6)

## 2021-08-13 LAB — PROTIME-INR
INR: 1.2 (ref 0.8–1.2)
Prothrombin Time: 15.5 seconds — ABNORMAL HIGH (ref 11.4–15.2)

## 2021-08-13 LAB — MAGNESIUM: Magnesium: 2.5 mg/dL — ABNORMAL HIGH (ref 1.7–2.4)

## 2021-08-13 MED ORDER — PANTOPRAZOLE SODIUM 40 MG IV SOLR
40.0000 mg | Freq: Two times a day (BID) | INTRAVENOUS | Status: DC
  Administered 2021-08-13 – 2021-08-15 (×4): 40 mg via INTRAVENOUS
  Filled 2021-08-13 (×4): qty 40

## 2021-08-13 NOTE — Consult Note (Signed)
[facility]  CONSULT NOTE    Date: 08/13/2021                  Patient Name:  Johnny [name]  MRN: [id number]  DOB: 14-Apr-1961  Age / Sex: 60 y.o., male         PCP: Revelo, Elyse Jarvis, MD                 Service Requesting Consult: Dr. Posey Pronto                 Reason for Consult: Acute kidney injury            History of Present Illness: Johnny [name] admitted for abdominal pain and suspected peritonitis. Patient unable to give much of a history due to pain. He was recent discharged from [facility] for pneumonia, sepsis and altered mental status. Patient reports not taking his medications at home. He admits to me that he has been drinking alcohol. Endorses poor PO intake. Patient denies any nausea, vomiting or diarrhea.   Nephrology consulted for acute kidney injury. Creatinine is stable from his admission to [facility] earlier this month.    Medications: Outpatient medications: Medications Prior to Admission  Medication Sig Dispense Refill Last Dose   amLODipine (NORVASC) 10 MG tablet Take 10 mg by mouth daily.   08/10/2021   aspirin 81 MG chewable tablet Chew 1 tablet (81 mg total) by mouth daily. 30 tablet 3 08/10/2021   Ferrous Sulfate (IRON) 325 (65 Fe) MG TABS Take 1 tablet by mouth 2 (two) times daily.   44/31/5400   folic acid (FOLVITE) 1 MG tablet Take 1 mg by mouth daily.   08/10/2021   Multiple Vitamin (QUINTABS) TABS Take 1 tablet by mouth daily.   08/10/2021   oxyCODONE (OXY IR/ROXICODONE) 5 MG immediate release tablet Take 5 mg by mouth every 4 (four) hours as needed.   Past Week   pantoprazole (PROTONIX) 40 MG tablet Take 40 mg by mouth 2 (two) times daily.   08/10/2021   propranolol (INDERAL) 20 MG tablet SMARTSIG:1 Tablet(s) By Mouth Every 12 Hours   08/10/2021   spironolactone (ALDACTONE) 25 MG tablet Take 1 tablet (25 mg total) by mouth daily. 30 tablet 2 08/10/2021   thiamine 100 MG tablet Take 100 mg by mouth daily.   08/10/2021    XIFAXAN 550 MG TABS tablet Take 550 mg by mouth 2 (two) times daily.   08/10/2021   zinc gluconate 50 MG tablet Take 50 mg by mouth daily.   08/10/2021   atorvastatin (LIPITOR) 40 MG tablet Take 1 tablet (40 mg total) by mouth daily at 6 PM. (Patient not taking: Reported on 08/11/2021) 30 tablet 3 Not Taking   cyclobenzaprine (FLEXERIL) 10 MG tablet Take 10 mg by mouth 3 (three) times daily. (Patient not taking: Reported on 08/11/2021)   Not Taking   gabapentin (NEURONTIN) 300 MG capsule Take 600 mg by mouth 3 (three) times daily. (Patient not taking: Reported on 08/11/2021)   Completed Course   LORazepam (ATIVAN) 0.5 MG tablet Take by mouth. (Patient not taking: Reported on 08/11/2021)   Completed Course   traMADol (ULTRAM) 50 MG tablet Take 50 mg by mouth every 6 (six) hours as needed. (Patient not taking: Reported on 08/11/2021)   Completed Course   VOLTAREN 1 % GEL Apply 1 g topically 3 (three) times daily. (Patient not taking: Reported on 08/11/2021)   Not Taking    Current medications:  Current Facility-Administered Medications  Medication Dose Route Frequency Provider Last Rate Last Admin   cefTRIAXone (ROCEPHIN) 2 g in sodium chloride 0.9 % 100 mL IVPB  2 g Intravenous Q24H Adefeso, Oladapo, DO 200 mL/hr at 08/13/21 1200 2 g at 08/13/21 1200   melatonin tablet 2.5 mg  2.5 mg Oral QHS Irene Pap N, DO   2.5 mg at 08/12/21 2334   multivitamin with minerals tablet 1 tablet  1 tablet Oral Daily Irene Pap N, DO   1 tablet at 08/13/21 0867   oxyCODONE (Oxy IR/ROXICODONE) immediate release tablet 5 mg  5 mg Oral Q4H PRN Sharion Settler, NP   5 mg at 08/13/21 0915   pantoprazole (PROTONIX) EC tablet 40 mg  40 mg Oral BID Kayleen Memos, DO   40 mg at 08/13/21 0911   rifaximin (XIFAXAN) tablet 550 mg  550 mg Oral BID Kayleen Memos, DO   550 mg at 08/13/21 6195   sodium bicarbonate tablet 1,300 mg  1,300 mg Oral QID Irene Pap N, DO   1,300 mg at 08/13/21 0932   spironolactone  (ALDACTONE) tablet 25 mg  25 mg Oral Daily Kayleen Memos, DO   25 mg at 08/13/21 6712      Allergies: No Known Allergies    Past Medical History: Past Medical History:  Diagnosis Date   Alcoholism ([place])    Ascites    Cancer ([place])    Hep C w/ coma, chronic      Past Surgical History: Past Surgical History:  Procedure Laterality Date   COLONOSCOPY WITH PROPOFOL N/A 02/16/2017   Procedure: COLONOSCOPY WITH PROPOFOL;  Surgeon: Lollie Sails, MD;  Location: [name] ENDOSCOPY;  Service: Endoscopy;  Laterality: N/A;   ESOPHAGOGASTRODUODENOSCOPY (EGD) WITH PROPOFOL N/A 02/16/2017   Procedure: ESOPHAGOGASTRODUODENOSCOPY (EGD) WITH PROPOFOL;  Surgeon: Lollie Sails, MD;  Location: [facility] ENDOSCOPY;  Service: Endoscopy;  Laterality: N/A;   IR PARACENTESIS  08/12/2021   PARACENTESIS       Family History: No family history on file.   Social History: Social History   Socioeconomic History   Marital status: Divorced    Spouse name: Not on file   Number of children: Not on file   Years of education: Not on file   Highest education level: Not on file  Occupational History   Not on file  Tobacco Use   Smoking status: Never   Smokeless tobacco: Never  Substance and Sexual Activity   Alcohol use: Not Currently    Comment: 10 beers per day   Drug use: No   Sexual activity: Yes    Birth control/protection: None  Other Topics Concern   Not on file  Social History Narrative   Lives at home by himself, independent at baseline   Social Determinants of Health   Financial Resource Strain: Not on file  Food Insecurity: Not on file  Transportation Needs: Not on file  Physical Activity: Not on file  Stress: Not on file  Social Connections: Not on file  Intimate Partner Violence: Not on file     Vital Signs: Blood pressure 112/73, pulse 91, temperature 98 F (36.7 C), resp. rate 18, height 5\' 7"  (1.702 m), weight 62.1 kg, SpO2 93 %.  Weight trends: Filed Weights    08/11/21 0743  Weight: 62.1 kg    Physical Exam: General: NAD, laying in bed  Head: Normocephalic, atraumatic. Moist oral mucosal membranes  Eyes: Anicteric, PERRL  Neck: Supple, trachea midline  Lungs:  Clear to auscultation  Heart: Regular rate and rhythm  Abdomen:  +tender, +distended   Extremities:  no peripheral edema.  Neurologic: Nonfocal, moving all four extremities  Skin: No lesions         Lab results: Basic Metabolic Panel: Recent Labs  Lab 08/11/21 0747 08/12/21 0640 08/13/21 0438  NA 133* 136 134*  K 4.7 4.6 4.1  CL 107 107 105  CO2 18* 16* 17*  GLUCOSE 98 71 84  BUN 44* 50* 50*  CREATININE 1.82* 1.99* 1.56*  CALCIUM 9.1 9.1 9.3  MG  --  2.4 2.5*  PHOS  --  6.5* 4.8*    Liver Function Tests: Recent Labs  Lab 08/11/21 0747 08/12/21 0640 08/13/21 0438  AST 270* 319* 243*  ALT 88* 103* 74*  ALKPHOS 796* 988* 850*  BILITOT 3.1* 4.2* 5.3*  PROT 7.1 7.0 6.7  ALBUMIN 2.8* 2.8* 4.2   Recent Labs  Lab 08/11/21 0747  LIPASE 52*   No results for input(s): AMMONIA in the last 168 hours.  CBC: Recent Labs  Lab 08/11/21 0747 08/12/21 0640 08/13/21 0438  WBC 27.0* 22.4* 19.4*  HGB 11.9* 11.4* 8.1*  HCT 37.5* 34.6* 23.7*  MCV 86.4 85.4 82.9  PLT 341 294 217    Cardiac Enzymes: No results for input(s): CKTOTAL, CKMB, CKMBINDEX, TROPONINI in the last 168 hours.  BNP: Invalid input(s): POCBNP  CBG: No results for input(s): GLUCAP in the last 168 hours.  Microbiology: Results for orders placed or performed during the hospital encounter of 08/11/21  Resp Panel by RT-PCR (Flu A&B, Covid) Nasopharyngeal Swab     Status: None   Collection Time: 08/11/21 10:19 AM   Specimen: Nasopharyngeal Swab; Nasopharyngeal(NP) swabs in vial transport medium  Result Value Ref Range Status   SARS Coronavirus 2 by RT PCR NEGATIVE NEGATIVE Final    Comment: (NOTE) SARS-CoV-2 target nucleic acids are NOT DETECTED.  The SARS-CoV-2 RNA is generally detectable  in upper respiratory specimens during the acute phase of infection. The lowest concentration of SARS-CoV-2 viral copies this assay can detect is 138 copies/mL. A negative result does not preclude SARS-Cov-2 infection and should not be used as the sole basis for treatment or other patient management decisions. A negative result may occur with  improper specimen collection/handling, submission of specimen other than nasopharyngeal swab, presence of viral mutation(s) within the areas targeted by this assay, and inadequate number of viral copies(<138 copies/mL). A negative result must be combined with clinical observations, patient history, and epidemiological information. The expected result is Negative.  Fact Sheet for Patients:  EntrepreneurPulse.com.au  Fact Sheet for Healthcare Providers:  IncredibleEmployment.be  This test is no t yet approved or cleared by the [place] FDA and  has been authorized for detection and/or diagnosis of SARS-CoV-2 by FDA under an Emergency Use Authorization (EUA). This EUA will remain  in effect (meaning this test can be used) for the duration of the COVID-19 declaration under Section 564(b)(1) of the Act, 21 U.S.C.section 360bbb-3(b)(1), unless the authorization is terminated  or revoked sooner.       Influenza A by PCR NEGATIVE NEGATIVE Final   Influenza B by PCR NEGATIVE NEGATIVE Final    Comment: (NOTE) The Xpert Xpress SARS-CoV-2/FLU/RSV plus assay is intended as an aid in the diagnosis of influenza from Nasopharyngeal swab specimens and should not be used as a sole basis for treatment. Nasal washings and aspirates are unacceptable for Xpert Xpress SARS-CoV-2/FLU/RSV testing.  Fact Sheet for Patients: EntrepreneurPulse.com.au  Fact Sheet for Healthcare Providers: IncredibleEmployment.be  This test is not yet approved or cleared by the [place] FDA and has  been authorized for detection and/or diagnosis of SARS-CoV-2 by FDA under an Emergency Use Authorization (EUA). This EUA will remain in effect (meaning this test can be used) for the duration of the COVID-19 declaration under Section 564(b)(1) of the Act, 21 U.S.C. section 360bbb-3(b)(1), unless the authorization is terminated or revoked.  Performed at [facility], [address] [place], [place] [address]     Coagulation Studies: Recent Labs    08/11/21 1311 08/13/21 0438  LABPROT 14.0 15.5*  INR 1.1 1.2    Urinalysis: Recent Labs    08/11/21 1812  COLORURINE AMBER*  LABSPEC 1.010  PHURINE 5.0  GLUCOSEU NEGATIVE  HGBUR NEGATIVE  BILIRUBINUR MODERATE*  KETONESUR 15*  PROTEINUR 30*  NITRITE NEGATIVE  LEUKOCYTESUR NEGATIVE      Imaging: IR Paracentesis  Result Date: 08/12/2021 INDICATION: Recurrent ascites request received for paracentesis. EXAM: ULTRASOUND GUIDED PARACENTESIS MEDICATIONS: Local 1% lidocaine only. COMPLICATIONS: None immediate. PROCEDURE: Informed written consent was obtained from the patient after a discussion of the risks, benefits and alternatives to treatment. A timeout was performed prior to the initiation of the procedure. Initial ultrasound scanning demonstrates a large amount of ascites within the left lower abdominal quadrant. The left lower abdomen was prepped and draped in the usual sterile fashion. 1% lidocaine was used for local anesthesia. Following this, a 19 gauge, 7-cm, Yueh catheter was introduced. An ultrasound image was saved for documentation purposes. The paracentesis was performed. The patient complained of pain therefore the procedure was stopped. The catheter was removed and a dressing was applied. The patient tolerated the procedure well without immediate post procedural complication. FINDINGS: A total of approximately 3.3 L of clear yellow fluid was removed. IMPRESSION: Successful ultrasound-guided paracentesis yielding  3.3 liters of peritoneal fluid. This exam was performed by Tsosie Billing PA-C, and was supervised and interpreted by Dr. Pascal Lux. Electronically Signed   By: Sandi Mariscal M.D.   On: 08/12/2021 16:13     Assessment & Plan: Johnny [name] is a 60 y.o. black male with hepatitis C, alcoholism, hepatic cirrhosis, hepatic cellular carcinoma, ascites, GERD, who was admitted to [name] on 08/11/2021 for Malignant ascites [R18.0] Ascites [R18.8]  Acute kidney injury: with concerns of hepato-renal syndrome. Baseline creatinine of 1 with normal GFR on 07/12/21. However patient's AKI seems secondary to prerenal azotemia.  - encourage PO intake - low threshold for IV fluids - status post IV albumin   Ascites with spontaneous bacterial peritonitis: status post large volume paracentesis on 12/23 with 3.3 liters removed. No peritoneal fluid was sent to lab. Continue empiric antibiotics.   Hypertension: 112/73. Currently on spironolactone. He also take propranolol and amlodipine at home. Continue hold these agents as his blood pressure is soft.  Hyponatremia: secondary to hepatic cirrhosis.   Acute Metabolic acidosis: secondary to renal failure and liver failure.  - sodium bicarbonate.    LOS: 2 Julianny Milstein 12/24/202212:46 PM

## 2021-08-13 NOTE — Evaluation (Signed)
Occupational Therapy Evaluation Patient Details Name: Johnny [name] MRN: [id number] DOB: Aug 17, 1961 Today's Date: 08/13/2021   History of Present Illness Pt is a 60 y/o M who comes to [facility] on 12/22 due to progressive ABD swelling, family reports hx of acites and prior paracenteses. Recently admitted to [facility] from 12/12-12/15 due to altered mentation and malignant ascites. PMH: ETOH abuse, ascites, CA, Hep C, CVA (July 2020). Recent imaging revealing of new pulmonary nodules suspicious for metastatic disease as well as new lytic lesion of T5. Pt underwent 3.3 L paracentesis on 12/23.   Clinical Impression   Pt seen for OT evaluation this date in setting of acute hospitalization d/t ascites. Pt reports living alone and being INDEP at baseline. He presents this date with decreased fxl activity tolerance, strength and balance. He currently requires: SETUP for seated UB ADLs, MOD/MAX A for seated LB ADLs. CGA/SUPV for ADL transfers and fxl mobility with no AD. He c/o abdominal pain which limits tolerance for completing LB tasks so OT initiates ed re: AE use. Pt will require ongoing education. Will continue to follow acutely and recommend [facility] f/u.      Recommendations for follow up therapy are one component of a multi-disciplinary discharge planning process, led by the attending physician.  Recommendations may be updated based on patient status, additional functional criteria and insurance authorization.   Follow Up Recommendations  Home health OT    Assistance Recommended at Discharge Frequent or constant Supervision/Assistance  Functional Status Assessment  Patient has had a recent decline in their functional status and demonstrates the ability to make significant improvements in function in a reasonable and predictable amount of time.  Equipment Recommendations  Tub/shower seat    Recommendations for Other Services       Precautions / Restrictions Precautions Precautions:  Fall Restrictions Weight Bearing Restrictions: No      Mobility Bed Mobility Overal bed mobility: Modified Independent             General bed mobility comments: HOB elevated, use of rails    Transfers Overall transfer level: Needs assistance Equipment used: None Transfers: Sit to/from Stand Sit to Stand: Min guard;Supervision           General transfer comment: assist for steadying, cues for safety      Balance Overall balance assessment: Mild deficits observed, not formally tested   Sitting balance-Leahy Scale: Good       Standing balance-Leahy Scale: Fair                             ADL either performed or assessed with clinical judgement   ADL Overall ADL's : Needs assistance/impaired                                       General ADL Comments: requires SETUP for seated UB ADLs, MOD/MAX A for seated LB ADLs. CGA/SUPV for fxl mobility with no AD.     Vision Patient Visual Report: No change from baseline       Perception     Praxis      Pertinent Vitals/Pain Pain Assessment: 0-10 Pain Score: 6  Pain Location: abdomen Pain Descriptors / Indicators: Tightness;Tender Pain Intervention(s): Limited activity within patient's tolerance;Monitored during session     Hand Dominance     Extremity/Trunk Assessment Upper Extremity Assessment Upper Extremity Assessment: Generalized weakness  Lower Extremity Assessment Lower Extremity Assessment: Generalized weakness       Communication Communication Communication: No difficulties   Cognition Arousal/Alertness: Awake/alert Behavior During Therapy: WFL for tasks assessed/performed Overall Cognitive Status: Within Functional Limits for tasks assessed                                       General Comments       Exercises Other Exercises Other Exercises: ed re: role of OT   Shoulder Instructions      Home Living Family/patient expects to be  discharged to:: Private residence Living Arrangements: Alone Available Help at Discharge: Family;Available PRN/intermittently (grandson) Type of Home: Apartment Home Access: Stairs to enter [employer] of Steps: 2   Home Layout: One level               Home Equipment: Conservation officer, nature (2 wheels)   Additional Comments: doesnt use walker      Prior Functioning/Environment Prior Level of Function : Independent/Modified Independent               ADLs Comments: walks to grocery store that is right by his apt        OT Problem List: Decreased strength;Decreased activity tolerance;Decreased knowledge of use of DME or AE;Pain      OT Treatment/Interventions: Self-care/ADL training;Therapeutic exercise;DME and/or AE instruction;Therapeutic activities;Patient/family education;Balance training    OT Goals(Current goals can be found in the care plan section) Acute Rehab OT Goals Patient Stated Goal: to go home. OT Goal Formulation: With patient Time For Goal Achievement: 08/27/21 Potential to Achieve Goals: Good ADL Goals Pt Will Perform Lower Body Dressing: with modified independence;sit to/from stand;with adaptive equipment (AE to avoid abd discomfort) Pt Will Transfer to Toilet: with modified independence;ambulating Pt Will Perform Toileting - Clothing Manipulation and hygiene: with modified independence;sit to/from stand Pt/caregiver will Perform Home Exercise Program: Increased strength;Both right and left upper extremity;With Supervision  OT Frequency: Min 2X/week   Barriers to D/C:            Co-evaluation              AM-PAC OT "6 Clicks" Daily Activity     Outcome Measure Help from another person eating meals?: None Help from another person taking care of personal grooming?: A Little Help from another person toileting, which includes using toliet, bedpan, or urinal?: A Lot Help from another person bathing (including washing, rinsing, drying)?:  A Lot Help from another person to put on and taking off regular upper body clothing?: A Little Help from another person to put on and taking off regular lower body clothing?: A Lot 6 Click Score: 16   End of Session Equipment Utilized During Treatment: Gait belt Nurse Communication: Mobility status  Activity Tolerance: Patient tolerated treatment well Patient left: in bed;with call bell/phone within reach;with bed alarm set  OT Visit Diagnosis: Unsteadiness on feet (R26.81);Muscle weakness (generalized) (M62.81)                Time: 2751-7001 OT Time Calculation (min): 13 min Charges:  OT General Charges $OT Visit: 1 Visit OT Evaluation $OT Eval Moderate Complexity: [place], MS, OTR/L ascom [phone] 08/13/21, 12:19 PM

## 2021-08-13 NOTE — Evaluation (Signed)
Physical Therapy Evaluation Patient Details Name: Johnny [name] MRN: [id number] DOB: 1960-08-22 Today's Date: 08/13/2021  History of Present Illness  Johnny [name] is a 5yoM who comes to [facility] on 12/22 due to progressive ABD swelling, family reports hx of acites and prior paracenteses. Recently admitted to [facility] from 12/12-12/15 due to altered mentation and malignant ascites. PMH: ETOH abuse, ascites, CA, Hep C, CVA (July 2020). Recent imaging revealing of new pulmonary nodules suspicious for metastatic disease as well as new lytic lesion of T5. Pt underwent 3.3 L paracentesis on 12/23.  Clinical Impression  Pt admitted with above diagnosis. Pt currently with functional limitations due to the deficits listed below (see "PT Problem List"). Patient agreeable to PT evaluation, but is sleeping and confused for earlier part of session. Patient provides detailed description of PLOF and home environment. Pt requires no assistance physically for mobility, transfers, or AMB, but has limited safety awareness at times regarding IV. Patient's assessment this date reveals the patient requires additional time and effort to complete their typical ADL. At baseline, the patient is able to perform ADL with modified independence. Patient will benefit from skilled PT intervention to maximize independence and safety in mobility required for basic ADL performance at discharge.        Recommendations for follow up therapy are one component of a multi-disciplinary discharge planning process, led by the attending physician.  Recommendations may be updated based on patient status, additional functional criteria and insurance authorization.  Follow Up Recommendations Home health PT    Assistance Recommended at Discharge Intermittent Supervision/Assistance  Functional Status Assessment Patient has had a recent decline in their functional status and demonstrates the ability to make significant improvements in function  in a reasonable and predictable amount of time.  Equipment Recommendations  None recommended by PT    Recommendations for Other Services       Precautions / Restrictions Precautions Precautions: Fall Restrictions Weight Bearing Restrictions: No      Mobility  Bed Mobility Overal bed mobility: Modified Independent             General bed mobility comments: HOB elevated, use of rails    Transfers Overall transfer level: Needs assistance Equipment used: None Transfers: Sit to/from Stand Sit to Stand: Min guard           General transfer comment: pops right up, no frank LOB but appears unsure of self    Ambulation/Gait Ambulation/Gait assistance: Min guard Gait Distance (Feet): 200 Feet Assistive device: None Gait Pattern/deviations: WFL(Within Functional Limits)          Stairs            Wheelchair Mobility    Modified Rankin (Stroke Patients Only)       Balance Overall balance assessment: Mild deficits observed, not formally tested   Sitting balance-Leahy Scale: Good       Standing balance-Leahy Scale: Fair                               Pertinent Vitals/Pain Pain Assessment: 0-10 Pain Score: 7  Pain Location: abdomen Pain Descriptors / Indicators: Tightness;Tender Pain Intervention(s): Limited activity within patient's tolerance;Monitored during session;Repositioned    Home Living Family/patient expects to be discharged to:: Private residence Living Arrangements: Alone Available Help at Discharge: Family;Available PRN/intermittently (lives on same street as sister, also has other brothers, and another sister) Type of Home: Apartment Home Access: Stairs to enter  Entrance Stairs-Number of Steps: 2   Home Layout: One level Home Equipment: Conservation officer, nature (2 wheels) Additional Comments: doesnt use walker    Prior Function Prior Level of Function : Independent/Modified Independent               ADLs Comments:  not very clear about how he gets meals/food, mentions sister.     Hand Dominance        Extremity/Trunk Assessment   Upper Extremity Assessment Upper Extremity Assessment: Generalized weakness    Lower Extremity Assessment Lower Extremity Assessment: Generalized weakness       Communication   Communication: No difficulties  Cognition Arousal/Alertness: Awake/alert (somnolent) Behavior During Therapy: WFL for tasks assessed/performed Overall Cognitive Status: No family/caregiver present to determine baseline cognitive functioning (generally confused at times, says he lives with moo, then corrects to mom recently died in October 19, 2022, then tells Johnny [name] current month is either Jan or 10-19-22.)                                          General Comments      Exercises Other Exercises Other Exercises: ed re: role of OT   Assessment/Plan    PT Assessment Patient needs continued PT services  PT Problem List Decreased activity tolerance;Decreased strength;Decreased knowledge of use of DME       PT Treatment Interventions DME instruction;Gait training;Stair training;Functional mobility training;Therapeutic activities;Therapeutic exercise;Patient/family education;Neuromuscular re-education    PT Goals (Current goals can be found in the Care Plan section)  Acute Rehab PT Goals Patient Stated Goal: none stated    Frequency Min 2X/week   Barriers to discharge Decreased caregiver support lives alone    Co-evaluation               AM-PAC PT "6 Clicks" Mobility  Outcome Measure Help needed turning from your back to your side while in a flat bed without using bedrails?: A Little Help needed moving from lying on your back to sitting on the side of a flat bed without using bedrails?: A Little Help needed moving to and from a bed to a chair (including a wheelchair)?: A Little Help needed standing up from a chair using your arms (e.g., wheelchair or bedside chair)?: A  Little Help needed to walk in hospital room?: A Little Help needed climbing 3-5 steps with a railing? : A Little 6 Click Score: 18    End of Session Equipment Utilized During Treatment: Gait belt Activity Tolerance: Patient tolerated treatment well;Patient limited by fatigue;Patient limited by pain Patient left: in bed;with call bell/phone within reach;with bed alarm set   PT Visit Diagnosis: Difficulty in walking, not elsewhere classified (R26.2);Other abnormalities of gait and mobility (R26.89);Muscle weakness (generalized) (M62.81)    Time: 0569-7948 PT Time Calculation (min) (ACUTE ONLY): 15 min   Charges:   PT Evaluation $PT Eval Moderate Complexity: 1 Mod         1:39 PM, 08/13/21 Johnny [name], PT, DPT Physical Therapist - [facility]  [phone] ([place])    Elyn Krogh C 08/13/2021, 1:37 PM

## 2021-08-13 NOTE — TOC CM/SW Note (Signed)
°  Transition of Care [facility]) Screening Note   Patient Details  Name: Johnny [name] Date of Birth: 1961-04-08   Transition of Care [facility]) CM/SW Contact:    Magnus Ivan, LCSW Phone Number: 08/13/2021, 9:32 AM    Transition of Care Department [facility]) has reviewed patient and no TOC needs have been identified at this time. We will continue to monitor patient advancement through interdisciplinary progression rounds. If new patient transition needs arise, please place a TOC consult.

## 2021-08-13 NOTE — Progress Notes (Signed)
[place] [facility] [place] NAME: Johnny [name]    MR#:  [id number]  DATE OF BIRTH:  [birth year]  SUBJECTIVE:  Poor historian No family Walked with PT earleir  REVIEW OF SYSTEMS:   Review of Systems  Unable to perform ROS: Mental status change  Tolerating Diet: Tolerating PT:   DRUG ALLERGIES:  No Known Allergies  VITALS:  Blood pressure 112/73, pulse 91, temperature 98 F (36.7 C), resp. rate 18, height 5\' 7"  (1.702 m), weight 62.1 kg, SpO2 93 %.  PHYSICAL EXAMINATION:   Physical Exam  GENERAL:  60 y.o.-year-old patient lying in the bed with no acute distress. Thin,cachectic HEENT: Head atraumatic, normocephalic. Oropharynx and nasopharynx clear.  LUNGS: Normal breath sounds bilaterally, no wheezing, rales, rhonchi. No use of accessory muscles of respiration.  CARDIOVASCULAR: S1, S2 normal. No murmurs, rubs, or gallops.  ABDOMEN: Soft, nontender, distended. Ascites++, fluid thrill + EXTREMITIES: +edema b/l.    NEUROLOGIC: nonfocal PSYCHIATRIC:  patient is alert SKIN: No obvious rash, lesion, or ulcer.    LABORATORY PANEL:  CBC Recent Labs  Lab 08/13/21 0438  WBC 19.4*  HGB 8.1*  HCT 23.7*  PLT 217    Chemistries  Recent Labs  Lab 08/13/21 0438  NA 134*  K 4.1  CL 105  CO2 17*  GLUCOSE 84  BUN 50*  CREATININE 1.56*  CALCIUM 9.3  MG 2.5*  AST 243*  ALT 74*  ALKPHOS 850*  BILITOT 5.3*   Cardiac Enzymes No results for input(s): TROPONINI in the last 168 hours. RADIOLOGY:  IR Paracentesis  Result Date: 08/12/2021 INDICATION: Recurrent ascites request received for paracentesis. EXAM: ULTRASOUND GUIDED PARACENTESIS MEDICATIONS: Local 1% lidocaine only. COMPLICATIONS: None immediate. PROCEDURE: Informed written consent was obtained from the patient after a discussion of the risks, benefits and alternatives to treatment. A timeout was performed prior to the initiation of the procedure. Initial ultrasound  scanning demonstrates a large amount of ascites within the left lower abdominal quadrant. The left lower abdomen was prepped and draped in the usual sterile fashion. 1% lidocaine was used for local anesthesia. Following this, a 19 gauge, 7-cm, Yueh catheter was introduced. An ultrasound image was saved for documentation purposes. The paracentesis was performed. The patient complained of pain therefore the procedure was stopped. The catheter was removed and a dressing was applied. The patient tolerated the procedure well without immediate post procedural complication. FINDINGS: A total of approximately 3.3 L of clear yellow fluid was removed. IMPRESSION: Successful ultrasound-guided paracentesis yielding 3.3 liters of peritoneal fluid. This exam was performed by Tsosie Billing PA-C, and was supervised and interpreted by Dr. Pascal Lux. Electronically Signed   By: Sandi Mariscal M.D.   On: 08/12/2021 16:13   ASSESSMENT AND PLAN:  Johnny [name] is a 60 y.o. male with medical history significant for GERD, HTN, alcohol use with alcoholic cirrhosis, metastatic [place], recently admitted to [facility] on 08/01/2021 until 08/04/2021 due to altered mental status secondary to pneumonia was treated with vancomycin and Zosyn.  Patient presents this time to [facility] ED due to gradually worsening abdominal distention Paracentesis performed on 08/12/2021--no fluid labs were ordered  Severe abdominal distention secondary to large ascites --Patient presented with abdominal distention due to ascites --He states that he recently had paracentesis and abdominal fluid buildup was within few days --Post paracentesis by IR on 08/12/2021 with 3.3 L removed. --Follow-up body fluid analysis and culture---fluid was not sent on 12/23 --Continue empiric IV antibiotic Rocephin--change to oral  abx at discharge --12/24--pt is filling up quickly--will order paracentesis with labs  Decompensated alcoholic cirrhosis --Large ascites,  transaminitis --Resume home diuretic spironolactone --Resume home rifaximin and p.o. Protonix 40 mg twice daily --Restart propranolol once blood pressure is stable, currently soft BPs. --Low-sodium diet --Goal bowel movements 2-3 bowel movement per day --GI consult--appreciated   Hepatocellular carcinoma with nodal metastatic disease Multiple small bilateral pulmonary nodules measuring up to 2.5 cm, concerning for pulmonary metastatic disease --Follows with oncology at [facility], will need close follow-up   Jaundice/hyperbilirubinemia with cholelithiasis seen on CT scan --Meld score 21, unclear if he would be a surgical candidate --T.  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 --Presented with creatinine of 1.82 with GFR 41 --Creatinine uptrending 1.99 with GFR of 37. Avoid nephrotoxic agents and hypotension. Nephrology consult with dr Juleen China   Anion gap metabolic acidosis --Serum bicarb 16, anion gap 13 Start oral sodium bicarb replacement   Essential hypertension, BPs are soft. Maintain MAP greater than 65 Beta-blocker on hold until blood pressures are improved.   Multiple pulmonary nodules --CT abdomen and pelvis showed multiple small bilateral pulmonary nodules measuring up to 0.5 cm in the included bilateral lung bases, concerning for pulmonary metastatic disease although nonspecific --Continue follow-up with outpatient oncology   Alcohol use disorder/alcoholic cirrhosis --Patient denies any recent alcohol consumption --He was counseled on continued abstinence from alcohol use --No evidence of alcohol withdrawal at the time of the visit.    Physical debility PT OT to assess Fall precautions   Goals of care Meld score of 21 with estimated 33-month mortality 19.6% Palliative care consult  Overall very poor prognosis  Procedures:Paracentesis Family communication : Consults :GI, nephrology CODE STATUS: full DVT Prophylaxis :scd Level of care: Med-Surg Status is: Inpatient  Remains inpatient appropriate because: will need paracentesis      TOTAL TIME TAKING CARE OF THIS PATIENT: 30 minutes.  >50% time spent on counselling and coordination of care  Note: This dictation was prepared with Dragon dictation along with smaller phrase technology. Any transcriptional errors that result from this process are unintentional.  Fritzi Mandes M.D    [facility]   CC: Primary care physician; Alene Mires Elyse Jarvis, MD Patient ID: Johnny [name], male   DOB: 1961-02-05, 60 y.o.   MRN: [id number]

## 2021-08-13 NOTE — Progress Notes (Addendum)
Johnny Antigua, MD [address] [place], [place], [place], [address] [address], [place], [place], [place], [address] Phone: [phone]  Fax: [phone]   Subjective: Reports continued abdominal distension. Denies any Bowel movements or any evidence or sources of bleeding   Objective: Exam: Vital signs in last 24 hours: Vitals:   08/12/21 2354 08/13/21 0454 08/13/21 0810 08/13/21 2004  BP: 117/77 109/75 112/73 123/83  Pulse: 87 95 91 90  Resp: 20 20 18 18   Temp: 98.1 F (36.7 C) 98.4 F (36.9 C) 98 F (36.7 C) 98.6 F (37 C)  TempSrc: Oral Oral    SpO2: 98% 100% 93% 91%  Weight:      Height:       Weight change:   Intake/Output Summary (Last 24 hours) at 08/13/2021 2029 Last data filed at 08/13/2021 1500 Gross per 24 hour  Intake 200 ml  Output 150 ml  Net 50 ml    Constitutional: General:   Alert,  Well-developed, well-nourished, pleasant and cooperative in NAD BP 123/83 (BP Location: Right Arm)    Pulse 90    Temp 98.6 F (37 C)    Resp 18    Ht 5\' 7"  (1.702 m)    Wt 62.1 kg    SpO2 91%    BMI 21.46 kg/m   Eyes:  Sclera clear, no icterus.   Conjunctiva pink.   Ears:  No scars, lesions or masses, Normal auditory acuity. Nose:  No deformity, discharge, or lesions. Mouth:  No deformity or lesions, oropharynx pink & moist.  Neck:  Supple; no masses, trachea midline  Respiratory: Normal respiratory effort  Gastrointestinal:  Soft, non-tender, distended.  No guarding or rebound tenderness.     Cardiac: No clubbing or edema.  No cyanosis. Normal posterior tibial pedal pulses noted.  Lymphatic:  No significant cervical adenopathy.  Psych:  Alert and cooperative. Normal mood and affect.  Musculoskeletal:  Head normocephalic, atraumatic. 5/5 Lower extremity strength bilaterally.  Skin: Warm. Intact without significant lesions or rashes. No jaundice.  Neurologic:  Face symmetrical, tongue midline, Normal sensation to touch  Psych:  Alert  and oriented x3, Alert and cooperative. Normal mood and affect.   Lab Results: Lab Results  Component Value Date   WBC 19.4 (H) 08/13/2021   HGB 8.1 (L) 08/13/2021   HCT 23.7 (L) 08/13/2021   MCV 82.9 08/13/2021   PLT 217 08/13/2021   Micro Results: Recent Results (from the past 240 hour(s))  Resp Panel by RT-PCR (Flu A&B, Covid) Nasopharyngeal Swab     Status: None   Collection Time: 08/11/21 10:19 AM   Specimen: Nasopharyngeal Swab; Nasopharyngeal(NP) swabs in vial transport medium  Result Value Ref Range Status   SARS Coronavirus 2 by RT PCR NEGATIVE NEGATIVE Final    Comment: (NOTE) SARS-CoV-2 target nucleic acids are NOT DETECTED.  The SARS-CoV-2 RNA is generally detectable in upper respiratory specimens during the acute phase of infection. The lowest concentration of SARS-CoV-2 viral copies this assay can detect is 138 copies/mL. A negative result does not preclude SARS-Cov-2 infection and should not be used as the sole basis for treatment or other patient management decisions. A negative result may occur with  improper specimen collection/handling, submission of specimen other than nasopharyngeal swab, presence of viral mutation(s) within the areas targeted by this assay, and inadequate number of viral copies(<138 copies/mL). A negative result must be combined with clinical observations, patient history, and epidemiological information. The expected result is Negative.  Fact Sheet for  Patients:  EntrepreneurPulse.com.au  Fact Sheet for Healthcare Providers:  IncredibleEmployment.be  This test is no t yet approved or cleared by the [place] FDA and  has been authorized for detection and/or diagnosis of SARS-CoV-2 by FDA under an Emergency Use Authorization (EUA). This EUA will remain  in effect (meaning this test can be used) for the duration of the COVID-19 declaration under Section 564(b)(1) of the Act, 21 U.S.C.section  360bbb-3(b)(1), unless the authorization is terminated  or revoked sooner.       Influenza A by PCR NEGATIVE NEGATIVE Final   Influenza B by PCR NEGATIVE NEGATIVE Final    Comment: (NOTE) The Xpert Xpress SARS-CoV-2/FLU/RSV plus assay is intended as an aid in the diagnosis of influenza from Nasopharyngeal swab specimens and should not be used as a sole basis for treatment. Nasal washings and aspirates are unacceptable for Xpert Xpress SARS-CoV-2/FLU/RSV testing.  Fact Sheet for Patients: EntrepreneurPulse.com.au  Fact Sheet for Healthcare Providers: IncredibleEmployment.be  This test is not yet approved or cleared by the [place] FDA and has been authorized for detection and/or diagnosis of SARS-CoV-2 by FDA under an Emergency Use Authorization (EUA). This EUA will remain in effect (meaning this test can be used) for the duration of the COVID-19 declaration under Section 564(b)(1) of the Act, 21 U.S.C. section 360bbb-3(b)(1), unless the authorization is terminated or revoked.  Performed at [facility], [address] [place], [place] [address]    Studies/Results: IR Paracentesis  Result Date: 08/12/2021 INDICATION: Recurrent ascites request received for paracentesis. EXAM: ULTRASOUND GUIDED PARACENTESIS MEDICATIONS: Local 1% lidocaine only. COMPLICATIONS: None immediate. PROCEDURE: Informed written consent was obtained from the patient after a discussion of the risks, benefits and alternatives to treatment. A timeout was performed prior to the initiation of the procedure. Initial ultrasound scanning demonstrates a large amount of ascites within the left lower abdominal quadrant. The left lower abdomen was prepped and draped in the usual sterile fashion. 1% lidocaine was used for local anesthesia. Following this, a 19 gauge, 7-cm, Yueh catheter was introduced. An ultrasound image was saved for documentation purposes. The  paracentesis was performed. The patient complained of pain therefore the procedure was stopped. The catheter was removed and a dressing was applied. The patient tolerated the procedure well without immediate post procedural complication. FINDINGS: A total of approximately 3.3 L of clear yellow fluid was removed. IMPRESSION: Successful ultrasound-guided paracentesis yielding 3.3 liters of peritoneal fluid. This exam was performed by Tsosie Billing PA-C, and was supervised and interpreted by Dr. Pascal Lux. Electronically Signed   By: Sandi Mariscal M.D.   On: 08/12/2021 16:13   Medications:  Scheduled Meds:  melatonin  2.5 mg Oral QHS   multivitamin with minerals  1 tablet Oral Daily   pantoprazole  40 mg Oral BID   rifaximin  550 mg Oral BID   sodium bicarbonate  1,300 mg Oral QID   spironolactone  25 mg Oral Daily   Continuous Infusions:  cefTRIAXone (ROCEPHIN)  IV 2 g (08/13/21 1200)   PRN Meds:.oxyCODONE   Assessment: Principal Problem:   Ascites Active Problems:   Leukocytosis   Pulmonary nodules/lesions, multiple   Abdominal distention   Hepatorenal failure (HCC)   Hepatocellular carcinoma (HCC)  Anemia  Plan: Hemoglobin noted to be lower than baseline today but no signs of active GI bleeding or any other evidence of bleeding  Monitor Hemoglobin closely and evaluate paracentesis fluid for RBC/blood as potential source of acute anemia  Pt is already on PPI  PO BID prior to admission and as an inpatient as well. Will change that to PPI IV BID at this time (and this can be changed back if Hemoglobin stabilizes and pt continues to not have any evidence of active GI bleeding). 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 Benefits of medication thus outweigh risks. No  clinical evidence of variceal bleeding at this time to indicate Octreotide.  Continue serial CBCs and transfuse PRN Avoid NSAIDs Please page GI with any acute hemodynamic changes, or signs of active GI bleeding. (No indication of emergent EGD at this time in the absence of any signs of active GI bleeding)  Repeat paracentesis with fluid studies has been ordered by primary team and is pending at this time and pt is on antibiotics due to elevated white count for potential SBP.   Liver enzymes showed improvement in transaminases and alkaline phosphatase. Elevation present since admission at [facility] and due to ongoing alcohol use, and known [place] and progression of disease. CBD reported to be non-dilated and no CBD obstruction reported on more than one CT scan  Poor prognosis  Palliative care consult pending    LOS: 2 days   Johnny Antigua, MD 08/13/2021, 8:29 PM

## 2021-08-14 ENCOUNTER — Inpatient Hospital Stay: Payer: Medicaid Other

## 2021-08-14 LAB — BODY FLUID CELL COUNT WITH DIFFERENTIAL
Eos, Fluid: 0 %
Lymphs, Fluid: 10 %
Monocyte-Macrophage-Serous Fluid: 53 %
Neutrophil Count, Fluid: 37 %
Total Nucleated Cell Count, Fluid: 185 cu mm

## 2021-08-14 LAB — CBC
HCT: 28.1 % — ABNORMAL LOW (ref 39.0–52.0)
Hemoglobin: 9.4 g/dL — ABNORMAL LOW (ref 13.0–17.0)
MCH: 27.9 pg (ref 26.0–34.0)
MCHC: 33.5 g/dL (ref 30.0–36.0)
MCV: 83.4 fL (ref 80.0–100.0)
Platelets: 208 10*3/uL (ref 150–400)
RBC: 3.37 MIL/uL — ABNORMAL LOW (ref 4.22–5.81)
RDW: 20.4 % — ABNORMAL HIGH (ref 11.5–15.5)
WBC: 19.6 10*3/uL — ABNORMAL HIGH (ref 4.0–10.5)
nRBC: 0 % (ref 0.0–0.2)

## 2021-08-14 MED ORDER — PROPRANOLOL HCL 20 MG PO TABS
20.0000 mg | ORAL_TABLET | Freq: Two times a day (BID) | ORAL | Status: DC
  Administered 2021-08-14 – 2021-08-15 (×3): 20 mg via ORAL
  Filled 2021-08-14 (×4): qty 1

## 2021-08-14 NOTE — Progress Notes (Signed)
[facility]  ROUNDING NOTE   Subjective:   No complaints. Lethargic this morning.   Creatinine 1.56 (1.99)  Objective:  Vital signs in last 24 hours:  Temp:  [98.6 F (37 C)-99.4 F (37.4 C)] 99.4 F (37.4 C) (12/25 0912) Pulse Rate:  [90-98] 98 (12/25 0912) Resp:  [18] 18 (12/25 0912) BP: (115-123)/(78-83) 115/78 (12/25 0912) SpO2:  [91 %-98 %] 96 % (12/25 0912)  Weight change:  Filed Weights   08/11/21 0743  Weight: 62.1 kg    Intake/Output: I/O last 3 completed shifts: In: 200 [IV Piggyback:200] Out: 150 [Urine:150]   Intake/Output this shift:  No intake/output data recorded.  Physical Exam: General: NAD, laying in bed  Head: Normocephalic, atraumatic. Moist oral mucosal membranes  Eyes: Anicteric, PERRL  Neck: Supple, trachea midline  Lungs:  Clear to auscultation  Heart: Regular rate and rhythm  Abdomen:  +distended  Extremities:  No peripheral edema.  Neurologic: Nonfocal, moving all four extremities  Skin: No lesions        Basic Metabolic Panel: Recent Labs  Lab 08/11/21 0747 08/12/21 0640 08/13/21 0438  NA 133* 136 134*  K 4.7 4.6 4.1  CL 107 107 105  CO2 18* 16* 17*  GLUCOSE 98 71 84  BUN 44* 50* 50*  CREATININE 1.82* 1.99* 1.56*  CALCIUM 9.1 9.1 9.3  MG  --  2.4 2.5*  PHOS  --  6.5* 4.8*    Liver Function Tests: Recent Labs  Lab 08/11/21 0747 08/12/21 0640 08/13/21 0438  AST 270* 319* 243*  ALT 88* 103* 74*  ALKPHOS 796* 988* 850*  BILITOT 3.1* 4.2* 5.3*  PROT 7.1 7.0 6.7  ALBUMIN 2.8* 2.8* 4.2   Recent Labs  Lab 08/11/21 0747  LIPASE 52*   No results for input(s): AMMONIA in the last 168 hours.  CBC: Recent Labs  Lab 08/11/21 0747 08/12/21 0640 08/13/21 0438  WBC 27.0* 22.4* 19.4*  HGB 11.9* 11.4* 8.1*  HCT 37.5* 34.6* 23.7*  MCV 86.4 85.4 82.9  PLT 341 294 217    Cardiac Enzymes: No results for input(s): CKTOTAL, CKMB, CKMBINDEX, TROPONINI in the last 168 hours.  BNP: Invalid input(s):  POCBNP  CBG: No results for input(s): GLUCAP in the last 168 hours.  Microbiology: Results for orders placed or performed during the hospital encounter of 08/11/21  Resp Panel by RT-PCR (Flu A&B, Covid) Nasopharyngeal Swab     Status: None   Collection Time: 08/11/21 10:19 AM   Specimen: Nasopharyngeal Swab; Nasopharyngeal(NP) swabs in vial transport medium  Result Value Ref Range Status   SARS Coronavirus 2 by RT PCR NEGATIVE NEGATIVE Final    Comment: (NOTE) SARS-CoV-2 target nucleic acids are NOT DETECTED.  The SARS-CoV-2 RNA is generally detectable in upper respiratory specimens during the acute phase of infection. The lowest concentration of SARS-CoV-2 viral copies this assay can detect is 138 copies/mL. A negative result does not preclude SARS-Cov-2 infection and should not be used as the sole basis for treatment or other patient management decisions. A negative result may occur with  improper specimen collection/handling, submission of specimen other than nasopharyngeal swab, presence of viral mutation(s) within the areas targeted by this assay, and inadequate number of viral copies(<138 copies/mL). A negative result must be combined with clinical observations, patient history, and epidemiological information. The expected result is Negative.  Fact Sheet for Patients:  EntrepreneurPulse.com.au  Fact Sheet for Healthcare Providers:  IncredibleEmployment.be  This test is no t yet approved or cleared by the  [place] Architectural technologist and  has been authorized for detection and/or diagnosis of SARS-CoV-2 by FDA under an Print production planner (EUA). This EUA will remain  in effect (meaning this test can be used) for the duration of the COVID-19 declaration under Section 564(b)(1) of the Act, 21 U.S.C.section 360bbb-3(b)(1), unless the authorization is terminated  or revoked sooner.       Influenza A by PCR NEGATIVE NEGATIVE Final    Influenza B by PCR NEGATIVE NEGATIVE Final    Comment: (NOTE) The Xpert Xpress SARS-CoV-2/FLU/RSV plus assay is intended as an aid in the diagnosis of influenza from Nasopharyngeal swab specimens and should not be used as a sole basis for treatment. Nasal washings and aspirates are unacceptable for Xpert Xpress SARS-CoV-2/FLU/RSV testing.  Fact Sheet for Patients: EntrepreneurPulse.com.au  Fact Sheet for Healthcare Providers: IncredibleEmployment.be  This test is not yet approved or cleared by the [place] FDA and has been authorized for detection and/or diagnosis of SARS-CoV-2 by FDA under an Emergency Use Authorization (EUA). This EUA will remain in effect (meaning this test can be used) for the duration of the COVID-19 declaration under Section 564(b)(1) of the Act, 21 U.S.C. section 360bbb-3(b)(1), unless the authorization is terminated or revoked.  Performed at [facility], [address] [place], [place] [address]     Coagulation Studies: Recent Labs    08/11/21 1311 08/13/21 0438  LABPROT 14.0 15.5*  INR 1.1 1.2    Urinalysis: Recent Labs    08/11/21 1812  COLORURINE AMBER*  LABSPEC 1.010  PHURINE 5.0  GLUCOSEU NEGATIVE  HGBUR NEGATIVE  BILIRUBINUR MODERATE*  KETONESUR 15*  PROTEINUR 30*  NITRITE NEGATIVE  LEUKOCYTESUR NEGATIVE      Imaging: IR Paracentesis  Result Date: 08/12/2021 INDICATION: Recurrent ascites request received for paracentesis. EXAM: ULTRASOUND GUIDED PARACENTESIS MEDICATIONS: Local 1% lidocaine only. COMPLICATIONS: None immediate. PROCEDURE: Informed written consent was obtained from the patient after a discussion of the risks, benefits and alternatives to treatment. A timeout was performed prior to the initiation of the procedure. Initial ultrasound scanning demonstrates a large amount of ascites within the left lower abdominal quadrant. The left lower abdomen was prepped and draped  in the usual sterile fashion. 1% lidocaine was used for local anesthesia. Following this, a 19 gauge, 7-cm, Yueh catheter was introduced. An ultrasound image was saved for documentation purposes. The paracentesis was performed. The patient complained of pain therefore the procedure was stopped. The catheter was removed and a dressing was applied. The patient tolerated the procedure well without immediate post procedural complication. FINDINGS: A total of approximately 3.3 L of clear yellow fluid was removed. IMPRESSION: Successful ultrasound-guided paracentesis yielding 3.3 liters of peritoneal fluid. This exam was performed by Tsosie Billing PA-C, and was supervised and interpreted by Dr. Pascal Lux. Electronically Signed   By: Sandi Mariscal M.D.   On: 08/12/2021 16:13     Medications:    cefTRIAXone (ROCEPHIN)  IV 2 g (08/13/21 1200)    melatonin  2.5 mg Oral QHS   multivitamin with minerals  1 tablet Oral Daily   pantoprazole (PROTONIX) IV  40 mg Intravenous Q12H   propranolol  20 mg Oral BID   rifaximin  550 mg Oral BID   sodium bicarbonate  1,300 mg Oral QID   spironolactone  25 mg Oral Daily   oxyCODONE  Assessment/ Plan:  Johnny [name] is a 60 y.o. black male with hepatitis C, alcoholism, hepatic cirrhosis, hepatic cellular carcinoma, ascites, GERD, who was admitted  to [facility] on 08/11/2021 for Malignant ascites [R18.0] Ascites [R18.8]   Acute kidney injury: with concerns of hepato-renal syndrome. Baseline creatinine of 1 with normal GFR on 07/12/21. However patient's AKI seems secondary to prerenal azotemia.  - encourage PO intake - low threshold for IV fluids - status post IV albumin    Ascites with spontaneous bacterial peritonitis: status post large volume paracentesis on 12/23 with 3.3 liters removed. No peritoneal fluid was sent to lab. Continue empiric antibiotics.    Hypertension: 115/78. Currently on spironolactone. He also take propranolol and amlodipine at home. Continue  hold these agents as his blood pressure is soft.   Hyponatremia: Na 134. secondary to hepatic cirrhosis.    Acute Metabolic acidosis: secondary to renal failure and liver failure.  - Continue sodium bicarbonate.    LOS: 3 Daanish Copes 12/25/202210:31 AM

## 2021-08-14 NOTE — Plan of Care (Signed)
progressing 

## 2021-08-14 NOTE — Progress Notes (Signed)
[place] [facility] [place] NAME: Johnny [name]    MR#:  [id number]  DATE OF BIRTH:  November 09, 1960  SUBJECTIVE:  Poor historian No family I am ready to go home Denies abd pain  REVIEW OF SYSTEMS:   Review of Systems  Constitutional:  Negative for chills, fever and weight loss.  HENT:  Negative for ear discharge, ear pain and nosebleeds.   Eyes:  Negative for blurred vision, pain and discharge.  Respiratory:  Negative for sputum production, shortness of breath, wheezing and stridor.   Cardiovascular:  Negative for chest pain, palpitations, orthopnea and PND.  Gastrointestinal:  Negative for abdominal pain, diarrhea, nausea and vomiting.  Genitourinary:  Negative for frequency and urgency.  Musculoskeletal:  Negative for back pain and joint pain.  Neurological:  Positive for weakness. Negative for sensory change, speech change and focal weakness.  Psychiatric/Behavioral:  Negative for depression and hallucinations. The patient is not nervous/anxious.   Tolerating Diet:yes Tolerating PT: HHPT  DRUG ALLERGIES:  No Known Allergies  VITALS:  Blood pressure 115/78, pulse 98, temperature 99.4 F (37.4 C), temperature source Oral, resp. rate 18, height 5\' 7"  (1.702 m), weight 62.1 kg, SpO2 96 %.  PHYSICAL EXAMINATION:   Physical Exam  GENERAL:  60 y.o.-year-old patient lying in the bed with no acute distress. Thin,cachectic HEENT: Head atraumatic, normocephalic. Oropharynx and nasopharynx clear.  LUNGS: Normal breath sounds bilaterally, no wheezing, rales, rhonchi. No use of accessory muscles of respiration.  CARDIOVASCULAR: S1, S2 normal. No murmurs, rubs, or gallops.  ABDOMEN: Soft, nontender, distended. Ascites++, fluid thrill + EXTREMITIES: +edema b/l.    NEUROLOGIC: nonfocal PSYCHIATRIC:  patient is alert SKIN: No obvious rash, lesion, or ulcer.    LABORATORY PANEL:  CBC Recent Labs  Lab 08/14/21 1130  WBC 19.6*  HGB 9.4*  HCT  28.1*  PLT 208     Chemistries  Recent Labs  Lab 08/13/21 0438  NA 134*  K 4.1  CL 105  CO2 17*  GLUCOSE 84  BUN 50*  CREATININE 1.56*  CALCIUM 9.3  MG 2.5*  AST 243*  ALT 74*  ALKPHOS 850*  BILITOT 5.3*    Cardiac Enzymes No results for input(s): TROPONINI in the last 168 hours. RADIOLOGY:  US Paracentesis  Result Date: 08/14/2021 INDICATION: Abdominal distension, recurrent ascites, cirrhosis, hepatocellular carcinoma EXAM: ULTRASOUND GUIDED THERAPEUTIC PARACENTESIS MEDICATIONS: 1% LIDOCAINE LOCAL COMPLICATIONS: None immediate. PROCEDURE: Informed written consent was obtained from the patient after a discussion of the risks, benefits and alternatives to treatment. A timeout was performed prior to the initiation of the procedure. Initial ultrasound scanning demonstrates a large amount of ascites within the right lower abdominal quadrant. The right lower abdomen was prepped and draped in the usual sterile fashion. 1% lidocaine was used for local anesthesia. Following this, a 6 Fr Safe-T-Centesis catheter was introduced. An ultrasound image was saved for documentation purposes. The paracentesis was performed. The catheter was removed and a dressing was applied. The patient tolerated the procedure well without immediate post procedural complication. FINDINGS: A total of approximately 3.4 L of PERITONEAL fluid was removed. Sample was not sent for laboratory analysis. IMPRESSION: Successful ultrasound-guided paracentesis yielding 3.4 L liters of peritoneal fluid. Procedure performed by Candiss Norse, PA-C Supervising physician: [facility] Electronically Signed   By: Jerilynn Mages.  Shick M.D.   On: 08/14/2021 13:04   ASSESSMENT AND PLAN:  Johnny [name] is a 60 y.o. male with medical history significant for GERD, HTN, alcohol use with  alcoholic cirrhosis, metastatic [place], recently admitted to [facility] on 08/01/2021 until 08/04/2021 due to altered mental status secondary to pneumonia was treated with  vancomycin and Zosyn.  Patient presents this time to [facility] ED due to gradually worsening abdominal distention Paracentesis performed on 08/12/2021--no fluid labs were ordered  Severe abdominal distention secondary to large ascites --Patient presented with abdominal distention due to ascites --He states that he recently had paracentesis and abdominal fluid buildup was within few days --Post paracentesis by IR on 08/12/2021 with 3.3 L removed. --Follow-up body fluid analysis and culture---fluid was not sent on 12/23 --Continue empiric IV antibiotic Rocephin--change to oral abx at discharge --12/24--pt is filling up quickly--will order paracentesis with labs --12/25--repeat Abd paracentesis ordered --WBC 19K---will plan for SBP prophylaxis  Decompensated alcoholic cirrhosis --Large ascites, transaminitis --cont spironolactone --Resume home rifaximin and p.o. Protonix 40 mg twice daily --Restart propranolol once blood pressure is stable, currently soft BPs. --Low-sodium diet --Goal bowel movements 2-3 bowel movement per day --GI consult--appreciated   Hepatocellular carcinoma with nodal metastatic disease Multiple small bilateral pulmonary nodules measuring up to 2.5 cm, concerning for pulmonary metastatic disease --Follows with oncology at [facility], will need close follow-up   Jaundice/hyperbilirubinemia with cholelithiasis seen on CT scan --Meld score 21, unclear if he would be a surgical candidate --T.  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 --Presented with creatinine of 1.82 with GFR 41 --Creatinine uptrending 1.99 with GFR of 37. Avoid  nephrotoxic agents and hypotension. Nephrology consult with dr Juleen China   Anion gap metabolic acidosis --Serum bicarb 16, anion gap 13 Start oral sodium bicarb replacement   Essential hypertension, BPs are soft. Maintain MAP greater than 65 Beta-blocker on hold until blood pressures are improved.   Multiple pulmonary nodules --CT abdomen and pelvis showed multiple small bilateral pulmonary nodules measuring up to 0.5 cm in the included bilateral lung bases, concerning for pulmonary metastatic disease although nonspecific --Continue follow-up with outpatient oncology   Alcohol use disorder/alcoholic cirrhosis --Patient denies any recent alcohol consumption --He was counseled on continued abstinence from alcohol use --No evidence of alcohol withdrawal at the time of the visit.   Physical debility PT OT to assess Fall precautions   Goals of care Meld score of 21 with estimated 70-month mortality 19.6% Palliative care consult  Overall very poor prognosis  Procedures:Paracentesis Family communication : Consults :GI, nephrology CODE STATUS: full DVT Prophylaxis :scd Level of care: Med-Surg Status is: Inpatient  Remains inpatient appropriate because: will need paracentesis in am       TOTAL TIME TAKING CARE OF THIS PATIENT: 25 minutes.  >50% time spent on counselling and coordination of care  Note: This dictation was prepared with Dragon dictation along with smaller phrase technology. Any transcriptional errors that result from this process are unintentional.  Fritzi Mandes M.D    [facility]   CC: Primary care physician; Alene Mires Elyse Jarvis, MD Patient ID: [name], male   DOB: 05-Dec-1960, 60 y.o.   MRN: [id number]

## 2021-08-14 NOTE — Procedures (Signed)
PROCEDURE SUMMARY:  Successful image-guided paracentesis from the right lower abdomen.  Yielded 3.4 liters of clear yellow fluid.  No immediate complications.  EBL < 1 mL Patient tolerated well.   Specimen was sent for labs.  Please see imaging section of Epic for full dictation.  Joaquim Nam PA-C 08/14/2021 11:57 AM

## 2021-08-15 DIAGNOSIS — K7031 Alcoholic cirrhosis of liver with ascites: Secondary | ICD-10-CM | POA: Diagnosis not present

## 2021-08-15 DIAGNOSIS — D649 Anemia, unspecified: Secondary | ICD-10-CM | POA: Diagnosis not present

## 2021-08-15 MED ORDER — PANTOPRAZOLE SODIUM 40 MG PO TBEC: 40.0000 mg | DELAYED_RELEASE_TABLET | Freq: Every day | ORAL | Status: DC

## 2021-08-15 MED ORDER — OXYCODONE HCL 5 MG PO TABS: 5.0000 mg | ORAL_TABLET | Freq: Four times a day (QID) | ORAL | 0 refills | Status: AC | PRN

## 2021-08-15 MED ORDER — SODIUM BICARBONATE 650 MG PO TABS: 1300.0000 mg | ORAL_TABLET | Freq: Four times a day (QID) | ORAL | 0 refills | Status: AC

## 2021-08-15 NOTE — Progress Notes (Signed)
[facility]  ROUNDING NOTE   Subjective:   Complains of abdominal pain. No new labs today.   Objective:  Vital signs in last 24 hours:  Temp:  [98 F (36.7 C)-99.3 F (37.4 C)] 98 F (36.7 C) (12/26 0847) Pulse Rate:  [71-81] 71 (12/26 0847) Resp:  [16-18] 18 (12/26 0847) BP: (102-116)/(66-89) 110/79 (12/26 0847) SpO2:  [95 %-100 %] 100 % (12/26 0847)  Weight change:  Filed Weights   08/11/21 0743  Weight: 62.1 kg    Intake/Output: I/O last 3 completed shifts: In: 100 [IV Piggyback:100] Out: 0    Intake/Output this shift:  Total I/O In: 120 [P.O.:120] Out: -   Physical Exam: General: NAD, sitting on side of the bed  Head: Normocephalic, atraumatic. Moist oral mucosal membranes  Eyes: Anicteric, PERRL  Neck: Supple, trachea midline  Lungs:  Clear to auscultation  Heart: Regular rate and rhythm  Abdomen:  +distended, tender to palpation  Extremities:  No peripheral edema.  Neurologic: Nonfocal, moving all four extremities  Skin: No lesions        Basic Metabolic Panel: Recent Labs  Lab 08/11/21 0747 08/12/21 0640 08/13/21 0438  NA 133* 136 134*  K 4.7 4.6 4.1  CL 107 107 105  CO2 18* 16* 17*  GLUCOSE 98 71 84  BUN 44* 50* 50*  CREATININE 1.82* 1.99* 1.56*  CALCIUM 9.1 9.1 9.3  MG  --  2.4 2.5*  PHOS  --  6.5* 4.8*     Liver Function Tests: Recent Labs  Lab 08/11/21 0747 08/12/21 0640 08/13/21 0438  AST 270* 319* 243*  ALT 88* 103* 74*  ALKPHOS 796* 988* 850*  BILITOT 3.1* 4.2* 5.3*  PROT 7.1 7.0 6.7  ALBUMIN 2.8* 2.8* 4.2    Recent Labs  Lab 08/11/21 0747  LIPASE 52*    No results for input(s): AMMONIA in the last 168 hours.  CBC: Recent Labs  Lab 08/11/21 0747 08/12/21 0640 08/13/21 0438 08/14/21 1130  WBC 27.0* 22.4* 19.4* 19.6*  HGB 11.9* 11.4* 8.1* 9.4*  HCT 37.5* 34.6* 23.7* 28.1*  MCV 86.4 85.4 82.9 83.4  PLT 341 294 217 208     Cardiac Enzymes: No results for input(s): CKTOTAL, CKMB, CKMBINDEX,  TROPONINI in the last 168 hours.  BNP: Invalid input(s): POCBNP  CBG: No results for input(s): GLUCAP in the last 168 hours.  Microbiology: Results for orders placed or performed during the hospital encounter of 08/11/21  Resp Panel by RT-PCR (Flu A&B, Covid) Nasopharyngeal Swab     Status: None   Collection Time: 08/11/21 10:19 AM   Specimen: Nasopharyngeal Swab; Nasopharyngeal(NP) swabs in vial transport medium  Result Value Ref Range Status   SARS Coronavirus 2 by RT PCR NEGATIVE NEGATIVE Final    Comment: (NOTE) SARS-CoV-2 target nucleic acids are NOT DETECTED.  The SARS-CoV-2 RNA is generally detectable in upper respiratory specimens during the acute phase of infection. The lowest concentration of SARS-CoV-2 viral copies this assay can detect is 138 copies/mL. A negative result does not preclude SARS-Cov-2 infection and should not be used as the sole basis for treatment or other patient management decisions. A negative result may occur with  improper specimen collection/handling, submission of specimen other than nasopharyngeal swab, presence of viral mutation(s) within the areas targeted by this assay, and inadequate number of viral copies(<138 copies/mL). A negative result must be combined with clinical observations, patient history, and epidemiological information. The expected result is Negative.  Fact Sheet for Patients:  EntrepreneurPulse.com.au  Fact Sheet for Healthcare Providers:  IncredibleEmployment.be  This test is no t yet approved or cleared by the [place] FDA and  has been authorized for detection and/or diagnosis of SARS-CoV-2 by FDA under an Emergency Use Authorization (EUA). This EUA will remain  in effect (meaning this test can be used) for the duration of the COVID-19 declaration under Section 564(b)(1) of the Act, 21 U.S.C.section 360bbb-3(b)(1), unless the authorization is terminated  or revoked sooner.        Influenza A by PCR NEGATIVE NEGATIVE Final   Influenza B by PCR NEGATIVE NEGATIVE Final    Comment: (NOTE) The Xpert Xpress SARS-CoV-2/FLU/RSV plus assay is intended as an aid in the diagnosis of influenza from Nasopharyngeal swab specimens and should not be used as a sole basis for treatment. Nasal washings and aspirates are unacceptable for Xpert Xpress SARS-CoV-2/FLU/RSV testing.  Fact Sheet for Patients: EntrepreneurPulse.com.au  Fact Sheet for Healthcare Providers: IncredibleEmployment.be  This test is not yet approved or cleared by the [place] FDA and has been authorized for detection and/or diagnosis of SARS-CoV-2 by FDA under an Emergency Use Authorization (EUA). This EUA will remain in effect (meaning this test can be used) for the duration of the COVID-19 declaration under Section 564(b)(1) of the Act, 21 U.S.C. section 360bbb-3(b)(1), unless the authorization is terminated or revoked.  Performed at [facility], [address] [place], [place] [address]   Body fluid culture w Gram Stain     Status: None (Preliminary result)   Collection Time: 08/14/21 11:58 AM   Specimen: PATH Cytology Peritoneal fluid  Result Value Ref Range Status   Specimen Description   Final    PERITONEAL Performed at [facility], [address], [place], [place] [address]    Special Requests   Final    NONE Performed at [facility], [place], [place] [address]    Gram Stain NO WBC SEEN NO ORGANISMS SEEN   Final   Culture   Final    NO GROWTH < 24 HOURS Performed at [facility], [place] [address] [place], [place] [address]    Report Status PENDING  Incomplete    Coagulation Studies: Recent Labs    08/13/21 0438  LABPROT 15.5*  INR 1.2     Urinalysis: No results for input(s): COLORURINE, LABSPEC, PHURINE, GLUCOSEU, HGBUR, BILIRUBINUR, KETONESUR, PROTEINUR, UROBILINOGEN,  NITRITE, LEUKOCYTESUR in the last 72 hours.  Invalid input(s): APPERANCEUR     Imaging: US Paracentesis  Result Date: 08/14/2021 INDICATION: Abdominal distension, recurrent ascites, cirrhosis, hepatocellular carcinoma EXAM: ULTRASOUND GUIDED THERAPEUTIC PARACENTESIS MEDICATIONS: 1% LIDOCAINE LOCAL COMPLICATIONS: None immediate. PROCEDURE: Informed written consent was obtained from the patient after a discussion of the risks, benefits and alternatives to treatment. A timeout was performed prior to the initiation of the procedure. Initial ultrasound scanning demonstrates a large amount of ascites within the right lower abdominal quadrant. The right lower abdomen was prepped and draped in the usual sterile fashion. 1% lidocaine was used for local anesthesia. Following this, a 6 Fr Safe-T-Centesis catheter was introduced. An ultrasound image was saved for documentation purposes. The paracentesis was performed. The catheter was removed and a dressing was applied. The patient tolerated the procedure well without immediate post procedural complication. FINDINGS: A total of approximately 3.4 L of PERITONEAL fluid was removed. Sample was not sent for laboratory analysis. IMPRESSION: Successful ultrasound-guided paracentesis yielding 3.4 L liters of peritoneal fluid. Procedure performed by Candiss Norse, PA-C Supervising physician: Annamaria Boots  Electronically Signed   By: Jerilynn Mages.  Shick M.D.   On: 08/14/2021 13:04     Medications:    cefTRIAXone (ROCEPHIN)  IV 2 g (08/15/21 0818)    melatonin  2.5 mg Oral QHS   multivitamin with minerals  1 tablet Oral Daily   pantoprazole (PROTONIX) IV  40 mg Intravenous Q12H   propranolol  20 mg Oral BID   rifaximin  550 mg Oral BID   sodium bicarbonate  1,300 mg Oral QID   spironolactone  25 mg Oral Daily   oxyCODONE  Assessment/ Plan:  Johnny [name] is a 60 y.o. black male with hepatitis C, alcoholism, hepatic cirrhosis, hepatic cellular carcinoma, ascites,  GERD, who was admitted to [facility] on 08/11/2021 for Malignant ascites [R18.0] Ascites [R18.8]   Acute kidney injury: with concerns of hepato-renal syndrome. Baseline creatinine of 1 with normal GFR on 07/12/21. However patient's AKI seems secondary to prerenal azotemia.  - encourage PO intake - status post IV albumin    Ascites with spontaneous bacterial peritonitis: status post large volume paracentesis on 12/23 with 3.3 liters removed. No peritoneal fluid was sent to lab. Continue empiric antibiotics.    Hypertension: 110/79. Currently on spironolactone and propanolol. He also take amlodipine at home. Continue hold amlodipine as his blood pressure is soft.   Hyponatremia: Na 134. secondary to hepatic cirrhosis.    Acute Metabolic acidosis: secondary to renal failure and liver failure.  - Continue sodium bicarbonate.    LOS: 4 Johnny [name] 12/26/202211:25 AM

## 2021-08-15 NOTE — Discharge Instructions (Signed)
Family advised to get [facility] oncology appointment as soon as possible to discuss options regarding his new diagnosis of hepatocellular carcinoma  hospice to follow as outpatient.

## 2021-08-15 NOTE — Plan of Care (Signed)

## 2021-08-15 NOTE — Progress Notes (Signed)
Patient discharged to home via self/private vehicle. Patient discharge instructions given to patient and wife. No complaints or concerns. PIVX1 removed. Medications sent to [facility] per patient request.

## 2021-08-15 NOTE — TOC Transition Note (Signed)
Transition of Care [facility]) - Progression Note    Patient Details  Name: Johnny [name] MRN: [id number] Date of Birth: 10/21/1960  Transition of Care [facility]) CM/SW [place], [place] Phone Number: 08/15/2021, 2:50 PM  Clinical Narrative:     Patient will d/c home with [facility] hospice services.  Santiago Glad RN with [facility] spoke with patient and his sister, Johnny [name] [phone].  Santiago Glad will arrange any DME needed.  Expected Discharge Plan: [place]    Expected Discharge Plan and Services Expected Discharge Plan: [place]         Expected Discharge Date: 08/15/21                                     Social Determinants of Health (SDOH) Interventions    Readmission Risk Interventions No flowsheet data found.

## 2021-08-15 NOTE — Progress Notes (Signed)
[facility] Liaison note:  New referral for [employer] services at home received from [place]. Patient information sent to referral.  Hospice eligibility pending Hospice physician review.  Writer met at bedside with patient and his ex-wife Noha Karasik to initiate education regarding hospice services, philosophy and team approach to care with understanding voiced. Writer also spoke via telephone to patient's sister Kyra Leyland. Address and [facility] contact numbers confirmed. Patient has  walker at home per his report, no DME needs at discharge. Plan is for patient to discharge home via [place] today. Hospice information and contact numbers given to [facility].  Hospital care team updated. Thank you for the opportunity to be involved in the care of this patient and his family.  Flo Shanks BSN, RN, [place] [phone]

## 2021-08-15 NOTE — Discharge Summary (Addendum)
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 male with medical history significant for GERD, HTN, alcohol use with alcoholic cirrhosis, metastatic [place], recently admitted to [facility] on 08/01/2021 until 08/04/2021 due to altered mental status secondary to pneumonia was treated with vancomycin and Zosyn.  Patient presents this time to [facility] ED due to gradually worsening abdominal distention Paracentesis performed on 08/12/2021--no fluid labs were ordered   Severe abdominal distention secondary to large ascites --Patient presented with abdominal distention due to ascites --He states that he recently had paracentesis and abdominal fluid buildup was within few days --Post paracentesis by IR on 08/12/2021 with 3.3 L removed. --Follow-up body fluid analysis and culture---fluid was not sent on 12/23 --Continue empiric IV antibiotic Rocephin--change to oral abx at discharge --12/24--pt is filling up quickly--will order paracentesis with labs --12/25--repeat Abd paracentesis--3.4 liters removed -- Fluid cell count 183 --fluid culture negative so far --d/c rocephin. No indication for SBP prophylaxis since protein/creat ratio 2.5 (per labs 08/01/21--per GI)   Decompensated alcoholic cirrhosis --Large ascites, transaminitis --cont  spironolactone --Resume home rifaximin and p.o. Protonix 40 mg twice daily --Low-sodium diet --Goal bowel movements 2-3 bowel movement per day --GI consult--appreciated--d/ced PPI due to increased risk for SBP  Hepatocellular carcinoma with nodal metastatic disease Multiple small bilateral pulmonary nodules concerning for pulmonary metastatic disease --Follows with oncology at [facility], will need close follow-up   Jaundice/hyperbilirubinemia with cholelithiasis seen on CT scan --Meld score 21, unclear if he would be a surgical candidate --T.  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 --Presented with creatinine of 1.82 with GFR 41 --Creatinine uptrending 1.99 with GFR of 37. Avoid nephrotoxic agents and hypotension. Nephrology consult with dr Juleen China --creat down to 1.56 --avoid diuretics   Anion gap metabolic acidosis --Serum bicarb 16, anion gap 13 Start oral sodium bicarb replacement   Essential hypertension, BPs are soft. Maintain MAP greater than 65 Beta-blocker on hold until blood pressures are improved. D/c amlodipine   Multiple pulmonary nodules --CT abdomen and pelvis showed multiple small bilateral pulmonary nodules measuring up to 0.5 cm in the included bilateral lung bases, concerning for pulmonary metastatic disease although nonspecific --Continue follow-up with outpatient oncology   Alcohol use disorder/alcoholic cirrhosis --Patient denies any recent alcohol consumption --He was counseled on continued abstinence from alcohol use --No evidence of alcohol withdrawal at the time of the visit.   Physical debility PT OT --HH Fall  precautions   Goals of care Meld score of 21 with estimated 32-month mortality 19.6% Palliative care consult   Overall very poor prognosis.  Above was discussed with patient's sister Johnny [name] on the phone. She is patient's POA. Sr. understands patient has poor prognosis. She is trying to get appointment with [facility] oncology as well. Sister is requesting hospice follow at home. Discussed code status and sister agrees with DNR DNI.   Procedures:Paracentesis x2 Family communication :sister vera and ex-wife gwen Consults :GI, nephrology CODE STATUS: full DVT Prophylaxis :scd Level of care: Med-Surg Status is: Inpatient   patient is eager to go home. Family understands. Will discharged home with hospice to follow TOC for hospice referral    CONSULTS OBTAINED:  Treatment Team:  Virgel Manifold, MD  DRUG ALLERGIES:  No Known Allergies  DISCHARGE MEDICATIONS:   Allergies as of 08/15/2021   No Known Allergies      Medication List     STOP taking these medications    amLODipine 10 MG tablet Commonly known as: NORVASC   atorvastatin 40 MG tablet Commonly known as: LIPITOR   cyclobenzaprine 10 MG tablet Commonly known as: FLEXERIL   gabapentin 300 MG capsule Commonly known as: NEURONTIN   LORazepam 0.5 MG tablet Commonly known as: ATIVAN   pantoprazole 40 MG tablet Commonly known as: PROTONIX   traMADol 50 MG tablet Commonly known as: ULTRAM   Voltaren 1 % Gel Generic drug: diclofenac Sodium       TAKE these medications    aspirin 81 MG chewable tablet Chew 1 tablet (81 mg total) by mouth daily.   folic acid 1 MG tablet Commonly known as: FOLVITE Take 1 mg by mouth daily.   Iron 325 (65 Fe) MG Tabs Take 1 tablet by mouth 2 (two) times daily.   oxyCODONE 5 MG immediate release tablet Commonly known as: Oxy IR/ROXICODONE Take 1 tablet (5 mg total) by mouth every 6 (six) hours as needed for moderate pain or severe pain. What changed:  when  to take this reasons to take this   propranolol 20 MG tablet Commonly known as: INDERAL SMARTSIG:1 Tablet(s) By Mouth Every 12 Hours   Quintabs Tabs Take 1 tablet by mouth daily.   sodium bicarbonate 650 MG tablet Take 2 tablets (1,300 mg total) by mouth 4 (four) times daily.   spironolactone 25 MG tablet Commonly known as: ALDACTONE Take 1 tablet (25 mg total) by mouth daily.   thiamine 100 MG tablet Take 100 mg by mouth daily.   Xifaxan 550 MG Tabs tablet Generic drug: rifaximin Take 550 mg by mouth 2 (two) times daily.   zinc gluconate 50 MG tablet Take 50 mg by mouth daily.        If you experience worsening of your admission symptoms, develop shortness of breath, life threatening emergency, suicidal or homicidal thoughts you must seek medical attention immediately by calling 911 or calling your MD immediately  if symptoms less severe.  You Must read complete instructions/literature along with all the possible adverse reactions/side effects for all the Medicines you take and that have been prescribed to you. Take any new Medicines after you have completely understood and accept all the possible adverse reactions/side effects.   Please note  You were cared for by a hospitalist during your hospital stay. If you have any questions about your discharge medications or the care you received while you were in the hospital after you are discharged, you can call the unit and asked to speak with the hospitalist on call if the hospitalist that took care of you is not available. Once you are discharged, your primary  care physician will handle any further medical issues. Please note that NO REFILLS for any discharge medications will be authorized once you are discharged, as it is imperative that you return to your primary care physician (or establish a relationship with a primary care physician if you do not have one) for your aftercare needs so that they can reassess your need for  medications and monitor your lab values. Today   SUBJECTIVE   No new complaints  VITAL SIGNS:  Blood pressure 110/79, pulse 71, temperature 98 F (36.7 C), temperature source Oral, resp. rate 18, height 5\' 7"  (1.702 m), weight 62.1 kg, SpO2 100 %.  I/O:   Intake/Output Summary (Last 24 hours) at 08/15/2021 1433 Last data filed at 08/15/2021 1000 Gross per 24 hour  Intake 220 ml  Output 0 ml  Net 220 ml    PHYSICAL EXAMINATION:  GENERAL:  60 y.o.-year-old patient lying in the bed with no acute distress. Thin,cachectic HEENT: Head atraumatic, normocephalic. Oropharynx and nasopharynx clear.  LUNGS: Normal breath sounds bilaterally, no wheezing, rales, rhonchi. No use of accessory muscles of respiration.  CARDIOVASCULAR: S1, S2 normal. No murmurs, rubs, or gallops.  ABDOMEN: Soft, nontender, distended. Ascites++, fluid thrill + EXTREMITIES: +edema b/l.    NEUROLOGIC: nonfocal PSYCHIATRIC:  patient is alert SKIN: No obvious rash, lesion, or ulcer.     DATA REVIEW:   CBC  Recent Labs  Lab 08/14/21 1130  WBC 19.6*  HGB 9.4*  HCT 28.1*  PLT 208    Chemistries  Recent Labs  Lab 08/13/21 0438  NA 134*  K 4.1  CL 105  CO2 17*  GLUCOSE 84  BUN 50*  CREATININE 1.56*  CALCIUM 9.3  MG 2.5*  AST 243*  ALT 74*  ALKPHOS 850*  BILITOT 5.3*    Microbiology Results   Recent Results (from the past 240 hour(s))  Resp Panel by RT-PCR (Flu A&B, Covid) Nasopharyngeal Swab     Status: None   Collection Time: 08/11/21 10:19 AM   Specimen: Nasopharyngeal Swab; Nasopharyngeal(NP) swabs in vial transport medium  Result Value Ref Range Status   SARS Coronavirus 2 by RT PCR NEGATIVE NEGATIVE Final    Comment: (NOTE) SARS-CoV-2 target nucleic acids are NOT DETECTED.  The SARS-CoV-2 RNA is generally detectable in upper respiratory specimens during the acute phase of infection. The lowest concentration of SARS-CoV-2 viral copies this assay can detect is 138 copies/mL. A  negative result does not preclude SARS-Cov-2 infection and should not be used as the sole basis for treatment or other patient management decisions. A negative result may occur with  improper specimen collection/handling, submission of specimen other than nasopharyngeal swab, presence of viral mutation(s) within the areas targeted by this assay, and inadequate number of viral copies(<138 copies/mL). A negative result must be combined with clinical observations, patient history, and epidemiological information. The expected result is Negative.  Fact Sheet for Patients:  EntrepreneurPulse.com.au  Fact Sheet for Healthcare Providers:  IncredibleEmployment.be  This test is no t yet approved or cleared by the [place] FDA and  has been authorized for detection and/or diagnosis of SARS-CoV-2 by FDA under an Emergency Use Authorization (EUA). This EUA will remain  in effect (meaning this test can be used) for the duration of the COVID-19 declaration under Section 564(b)(1) of the Act, 21 U.S.C.section 360bbb-3(b)(1), unless the authorization is terminated  or revoked sooner.       Influenza A by PCR NEGATIVE NEGATIVE Final   Influenza B by PCR NEGATIVE  NEGATIVE Final    Comment: (NOTE) The Xpert Xpress SARS-CoV-2/FLU/RSV plus assay is intended as an aid in the diagnosis of influenza from Nasopharyngeal swab specimens and should not be used as a sole basis for treatment. Nasal washings and aspirates are unacceptable for Xpert Xpress SARS-CoV-2/FLU/RSV testing.  Fact Sheet for Patients: EntrepreneurPulse.com.au  Fact Sheet for Healthcare Providers: IncredibleEmployment.be  This test is not yet approved or cleared by the [place] FDA and has been authorized for detection and/or diagnosis of SARS-CoV-2 by FDA under an Emergency Use Authorization (EUA). This EUA will remain in effect (meaning this test can  be used) for the duration of the COVID-19 declaration under Section 564(b)(1) of the Act, 21 U.S.C. section 360bbb-3(b)(1), unless the authorization is terminated or revoked.  Performed at [facility], [address] [place], [place] [address]   Body fluid culture w Gram Stain     Status: None (Preliminary result)   Collection Time: 08/14/21 11:58 AM   Specimen: PATH Cytology Peritoneal fluid  Result Value Ref Range Status   Specimen Description   Final    PERITONEAL Performed at [facility], [address], [place], [place] [address]    Special Requests   Final    NONE Performed at [facility], [place], [place] [address]    Gram Stain NO WBC SEEN NO ORGANISMS SEEN   Final   Culture   Final    NO GROWTH < 24 HOURS Performed at [facility], [place] [address], [place], [place] [address]    Report Status PENDING  Incomplete    RADIOLOGY:  US Paracentesis  Result Date: 08/14/2021 INDICATION: Abdominal distension, recurrent ascites, cirrhosis, hepatocellular carcinoma EXAM: ULTRASOUND GUIDED THERAPEUTIC PARACENTESIS MEDICATIONS: 1% LIDOCAINE LOCAL COMPLICATIONS: None immediate. PROCEDURE: Informed written consent was obtained from the patient after a discussion of the risks, benefits and alternatives to treatment. A timeout was performed prior to the initiation of the procedure. Initial ultrasound scanning demonstrates a large amount of ascites within the right lower abdominal quadrant. The right lower abdomen was prepped and draped in the usual sterile fashion. 1% lidocaine was used for local anesthesia. Following this, a 6 Fr Safe-T-Centesis catheter was introduced. An ultrasound image was saved for documentation purposes. The paracentesis was performed. The catheter was removed and a dressing was applied. The patient tolerated the procedure well without immediate post procedural complication. FINDINGS: A total of approximately 3.4  L of PERITONEAL fluid was removed. Sample was not sent for laboratory analysis. IMPRESSION: Successful ultrasound-guided paracentesis yielding 3.4 L liters of peritoneal fluid. Procedure performed by Candiss Norse, PA-C Supervising physician: [facility] Electronically Signed   By: Jerilynn Mages.  Shick M.D.   On: 08/14/2021 13:04     CODE STATUS:     Code Status Orders  (From admission, onward)           Start     Ordered   08/15/21 1333  Do not attempt resuscitation (DNR)  Continuous       Question Answer Comment  In the event of cardiac or respiratory ARREST Do not call a code blue   In the event of cardiac or respiratory ARREST Do not perform Intubation, CPR, defibrillation or ACLS   In the event of cardiac or respiratory ARREST Use medication by any route, position, wound care, and other measures to relive pain and suffering. May use oxygen, suction and manual treatment of airway obstruction as needed for comfort.   Comments spoke with  sister Johnny [name] (POA)      08/15/21 1332           Code Status History     Date Active Date Inactive Code Status Order ID Comments User Context   08/11/2021 1921 08/15/2021 1332 Full Code [id number]  Bernadette Hoit, DO ED   02/21/2019 1609 02/22/2019 2243 Full Code [id number]  Gladstone Lighter, MD Inpatient        TOTAL TIME TAKING CARE OF THIS PATIENT: 35 minutes.    Fritzi Mandes M.D  [facility]    CC: Primary care physician; Alene Mires Elyse Jarvis, MD

## 2021-08-15 NOTE — Progress Notes (Addendum)
[name], MD [address], [place], [place], [place], [address] [address], [place], [place], [place], [address] Phone: [phone]  Fax: [phone]   Subjective:  Patient underwent paracentesis again yesterday with 3.4 L of peritoneal fluid removed and described as being yellow in color.  Patient feels much better since paracentesis yesterday with less abdominal distention  Objective: Exam: Vital signs in last 24 hours: Vitals:   08/14/21 1626 08/14/21 1954 08/15/21 0455 08/15/21 0847  BP: 113/81 116/89 102/66 110/79  Pulse: 76 81 76 71  Resp: 18 16 16 18   Temp: 98.6 F (37 C) 99.3 F (37.4 C) 98.3 F (36.8 C) 98 F (36.7 C)  TempSrc:  Oral Oral Oral  SpO2: 95% 96% 97% 100%  Weight:      Height:       Weight change:   Intake/Output Summary (Last 24 hours) at 08/15/2021 1430 Last data filed at 08/15/2021 1000 Gross per 24 hour  Intake 220 ml  Output 0 ml  Net 220 ml    Constitutional: General:   Alert,  Well-developed, well-nourished, pleasant and cooperative in NAD BP 110/79 (BP Location: Right Arm)    Pulse 71    Temp 98 F (36.7 C) (Oral)    Resp 18    Ht 5\' 7"  (1.702 m)    Wt 62.1 kg    SpO2 100%    BMI 21.46 kg/m   Eyes:  Sclera clear, no icterus.   Conjunctiva pink.   Ears:  No scars, lesions or masses, Normal auditory acuity. Nose:  No deformity, discharge, or lesions. Mouth:  No deformity or lesions, oropharynx pink & moist.  Neck:  Supple; no masses, trachea midline  Respiratory: Normal respiratory effort  Gastrointestinal:  Soft, non-tender and much less distended than on admission.  No guarding or rebound tenderness.     Cardiac: No clubbing or edema.  No cyanosis. Normal posterior tibial pedal pulses noted.  Lymphatic:  No significant cervical adenopathy.  Psych:  Alert and cooperative. Normal mood and affect.  Musculoskeletal:  Head normocephalic, atraumatic. 5/5 Lower extremity strength bilaterally.  Skin: Warm.  Intact without significant lesions or rashes. No jaundice.  Neurologic:  Face symmetrical, tongue midline, Normal sensation to touch  Psych:  Alert and oriented x3, Alert and cooperative. Normal mood and affect.   Lab Results: Lab Results  Component Value Date   WBC 19.6 (H) 08/14/2021   HGB 9.4 (L) 08/14/2021   HCT 28.1 (L) 08/14/2021   MCV 83.4 08/14/2021   PLT 208 08/14/2021   Micro Results: Recent Results (from the past 240 hour(s))  Resp Panel by RT-PCR (Flu A&B, Covid) Nasopharyngeal Swab     Status: None   Collection Time: 08/11/21 10:19 AM   Specimen: Nasopharyngeal Swab; Nasopharyngeal(NP) swabs in vial transport medium  Result Value Ref Range Status   SARS Coronavirus 2 by RT PCR NEGATIVE NEGATIVE Final    Comment: (NOTE) SARS-CoV-2 target nucleic acids are NOT DETECTED.  The SARS-CoV-2 RNA is generally detectable in upper respiratory specimens during the acute phase of infection. The lowest concentration of SARS-CoV-2 viral copies this assay can detect is 138 copies/mL. A negative result does not preclude SARS-Cov-2 infection and should not be used as the sole basis for treatment or other patient management decisions. A negative result may occur with  improper specimen collection/handling, submission of specimen other than nasopharyngeal swab, presence of viral mutation(s) within the areas targeted by this assay, and inadequate number of viral copies(<138 copies/mL).  A negative result must be combined with clinical observations, patient history, and epidemiological information. The expected result is Negative.  Fact Sheet for Patients:  EntrepreneurPulse.com.au  Fact Sheet for Healthcare Providers:  IncredibleEmployment.be  This test is no t yet approved or cleared by the [place] FDA and  has been authorized for detection and/or diagnosis of SARS-CoV-2 by FDA under an Emergency Use Authorization (EUA). This EUA will  remain  in effect (meaning this test can be used) for the duration of the COVID-19 declaration under Section 564(b)(1) of the Act, 21 U.S.C.section 360bbb-3(b)(1), unless the authorization is terminated  or revoked sooner.       Influenza A by PCR NEGATIVE NEGATIVE Final   Influenza B by PCR NEGATIVE NEGATIVE Final    Comment: (NOTE) The Xpert Xpress SARS-CoV-2/FLU/RSV plus assay is intended as an aid in the diagnosis of influenza from Nasopharyngeal swab specimens and should not be used as a sole basis for treatment. Nasal washings and aspirates are unacceptable for Xpert Xpress SARS-CoV-2/FLU/RSV testing.  Fact Sheet for Patients: EntrepreneurPulse.com.au  Fact Sheet for Healthcare Providers: IncredibleEmployment.be  This test is not yet approved or cleared by the [place] FDA and has been authorized for detection and/or diagnosis of SARS-CoV-2 by FDA under an Emergency Use Authorization (EUA). This EUA will remain in effect (meaning this test can be used) for the duration of the COVID-19 declaration under Section 564(b)(1) of the Act, 21 U.S.C. section 360bbb-3(b)(1), unless the authorization is terminated or revoked.  Performed at [facility], [address] [place], [place] [address]   Body fluid culture w Gram Stain     Status: None (Preliminary result)   Collection Time: 08/14/21 11:58 AM   Specimen: PATH Cytology Peritoneal fluid  Result Value Ref Range Status   Specimen Description   Final    PERITONEAL Performed at [facility], [address], [place], [place] [address]    Special Requests   Final    NONE Performed at [facility], [place], [place] [address]    Gram Stain NO WBC SEEN NO ORGANISMS SEEN   Final   Culture   Final    NO GROWTH < 24 HOURS Performed at [facility], [place] [address], [place], [place] [address]    Report Status PENDING  Incomplete    Studies/Results: US Paracentesis  Result Date: 08/14/2021 INDICATION: Abdominal distension, recurrent ascites, cirrhosis, hepatocellular carcinoma EXAM: ULTRASOUND GUIDED THERAPEUTIC PARACENTESIS MEDICATIONS: 1% LIDOCAINE LOCAL COMPLICATIONS: None immediate. PROCEDURE: Informed written consent was obtained from the patient after a discussion of the risks, benefits and alternatives to treatment. A timeout was performed prior to the initiation of the procedure. Initial ultrasound scanning demonstrates a large amount of ascites within the right lower abdominal quadrant. The right lower abdomen was prepped and draped in the usual sterile fashion. 1% lidocaine was used for local anesthesia. Following this, a 6 Fr Safe-T-Centesis catheter was introduced. An ultrasound image was saved for documentation purposes. The paracentesis was performed. The catheter was removed and a dressing was applied. The patient tolerated the procedure well without immediate post procedural complication. FINDINGS: A total of approximately 3.4 L of PERITONEAL fluid was removed. Sample was not sent for laboratory analysis. IMPRESSION: Successful ultrasound-guided paracentesis yielding 3.4 L liters of peritoneal fluid. Procedure performed by Candiss Norse, PA-C Supervising physician: [facility] Electronically Signed   By: Jerilynn Mages.  Shick M.D.   On: 08/14/2021 13:04   Medications:  Scheduled Meds:  melatonin  2.5 mg Oral QHS   multivitamin with minerals  1 tablet Oral Daily   propranolol  20 mg Oral BID   rifaximin  550 mg Oral BID   sodium bicarbonate  1,300 mg Oral QID   spironolactone  25 mg Oral Daily   Continuous Infusions: PRN Meds:.oxyCODONE   Assessment: Principal Problem:   Ascites Active Problems:   Leukocytosis   Pulmonary nodules/lesions, multiple   Abdominal distention   Hepatorenal failure (HCC)   Hepatocellular carcinoma (HCC)    Plan: Patient has not had any signs of active GI bleeding Protonix  discontinued as it can raise risks of adverse effects in the setting of cirrhosis (increased risks of SBP, mortality and others), as has been suggested in various studies  Given no evidence of active GI bleeding since admission and prior to admission, and hemoglobin improved, no indication to continue Protonix at this time. Risks of medication in the setting or cirrhosis would outweigh benefits  Abstinence encouraged Close follow-up in GI clinic with primary GI Patient is on spironolactone and nephrology is following.  This will need to be titrated carefully given his renal function  I was asked about SBP prophylaxis in this patient.  He has not had any previous episodes of SBP. As per literature on update:    He does not meet any of the above criteria, given that he does not have any evidence of gastrointestinal bleeding.  No melena, no hematochezia, no other evidence of GI bleeding on this admission.  He has not had any previous episodes of SBP.  Ascitic fluid protein is not available on fluid studies done on this admission, but was 2.5 g/dL on 08/01/2021.  Therefore, since patient does not meet any of the above criteria for SBP prophylaxis, antibiotic use for prophylaxis may increase risks associated with antibiotics itself such as causing resistance and other adverse effects  I have messaged Christianne London his primary GI to ensure he has proper follow-up with them after discharge as well  Above plan discussed with Dr. Posey Pronto as well   LOS: 4 days   [name], MD 08/15/2021, 2:30 PM

## 2021-08-17 LAB — CYTOLOGY - NON PAP

## 2021-08-18 LAB — BODY FLUID CULTURE W GRAM STAIN
Culture: NO GROWTH
Gram Stain: NONE SEEN

## 2021-08-19 ENCOUNTER — Other Ambulatory Visit: Payer: Self-pay | Admitting: Gastroenterology

## 2021-08-19 ENCOUNTER — Ambulatory Visit
Admission: RE | Admit: 2021-08-19 | Discharge: 2021-08-19 | Disposition: A | Discharge status: Home / Self Care | Payer: Medicaid Other | Source: Ambulatory Visit | Attending: Gastroenterology | Admitting: Gastroenterology

## 2021-08-19 DIAGNOSIS — R180 Malignant ascites: Secondary | ICD-10-CM

## 2021-08-19 DIAGNOSIS — K7031 Alcoholic cirrhosis of liver with ascites: Secondary | ICD-10-CM | POA: Insufficient documentation

## 2021-08-19 LAB — BODY FLUID CELL COUNT WITH DIFFERENTIAL
Eos, Fluid: 0 %
Lymphs, Fluid: 79 %
Monocyte-Macrophage-Serous Fluid: 11 % — ABNORMAL LOW (ref 50–90)
Neutrophil Count, Fluid: 10 % (ref 0–25)
Total Nucleated Cell Count, Fluid: 63 cu mm (ref 0–1000)

## 2021-08-19 NOTE — Procedures (Signed)
Ultrasound-guided diagnostic and therapeutic paracentesis performed yielding 3.2 liters of golden yellow fluid. No immediate complications. A portion of the fluid was sent to the lab for preordered studies. EBL none.

## 2021-08-23 LAB — BODY FLUID CULTURE W GRAM STAIN
Culture: NO GROWTH
Gram Stain: NONE SEEN

## 2021-08-24 LAB — PATHOLOGIST SMEAR REVIEW: Path Review: INCREASED

## 2021-08-26 LAB — CYTOLOGY - NON PAP

## 2021-09-21 DEATH — deceased
